# Patient Record
Sex: Female | Born: 2000 | Race: Black or African American | Hispanic: No | Smoking: Never smoker
Health system: Southern US, Community
[De-identification: ages and names within clinical notes are randomized; demographics above are authoritative.]

## PROBLEM LIST (undated history)

## (undated) DIAGNOSIS — F32A Depression, unspecified: Secondary | ICD-10-CM

## (undated) DIAGNOSIS — D649 Anemia, unspecified: Secondary | ICD-10-CM

## (undated) DIAGNOSIS — F329 Major depressive disorder, single episode, unspecified: Secondary | ICD-10-CM

## (undated) HISTORY — DX: Depression, unspecified: F32.A

## (undated) HISTORY — PX: NO PAST SURGERIES: SHX2092

---
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
## 2019-05-03 LAB — OB RESULTS CONSOLE HGB/HCT, BLOOD
HCT: 38 (ref 29–41)
Hemoglobin: 12.6

## 2019-05-03 LAB — OB RESULTS CONSOLE GC/CHLAMYDIA
Chlamydia: POSITIVE
Gonorrhea: NEGATIVE

## 2019-05-03 LAB — OB RESULTS CONSOLE ANTIBODY SCREEN: Antibody Screen: NEGATIVE

## 2019-05-03 LAB — OB RESULTS CONSOLE ABO/RH: RH Type: NEGATIVE

## 2019-05-03 LAB — OB RESULTS CONSOLE HEPATITIS B SURFACE ANTIGEN: Hepatitis B Surface Ag: NEGATIVE

## 2019-05-03 LAB — OB RESULTS CONSOLE RUBELLA ANTIBODY, IGM: Rubella: IMMUNE

## 2019-05-03 LAB — OB RESULTS CONSOLE HIV ANTIBODY (ROUTINE TESTING): HIV: NONREACTIVE

## 2019-07-11 ENCOUNTER — Other Ambulatory Visit: Payer: Self-pay

## 2019-07-11 ENCOUNTER — Ambulatory Visit (INDEPENDENT_AMBULATORY_CARE_PROVIDER_SITE_OTHER): Payer: Self-pay | Admitting: Certified Nurse Midwife

## 2019-07-11 ENCOUNTER — Encounter: Payer: Self-pay | Admitting: Certified Nurse Midwife

## 2019-07-11 VITALS — BP 100/55 | HR 77 | Ht 64.0 in | Wt 115.3 lb

## 2019-07-11 DIAGNOSIS — Z3402 Encounter for supervision of normal first pregnancy, second trimester: Secondary | ICD-10-CM

## 2019-07-11 DIAGNOSIS — Z3A23 23 weeks gestation of pregnancy: Secondary | ICD-10-CM | POA: Diagnosis not present

## 2019-07-11 NOTE — Progress Notes (Signed)
TRANSFER IN OB HISTORY AND PHYSICAL  SUBJECTIVE:       Holly Montgomery is a 18 y.o. G49P0010 female, Patient's last menstrual period was 01/28/2019., Estimated Date of Delivery: 11/04/19, [redacted]w[redacted]d, presents today for Transition of Prenatal Care. EPIC data migration from outside records is accomplished today. Complaints today include: none   Recently moved from [place] to be near her boyfriends family.   Gynecologic History Patient's last menstrual period was 01/28/2019. Normal Contraception: none Last Pap: N/A Body mass index is 19.79 kg/m.  Obstetric History OB History  Gravida Para Term Preterm AB Living  2       1    SAB TAB Ectopic Multiple Live Births  1            # Outcome Date GA Lbr Len/2nd Weight Sex Delivery Anes PTL Lv  2 Current           1 SAB 2019            Past Medical History:  Diagnosis Date  . Depression     History reviewed. No pertinent surgical history.  Current Outpatient Medications on File Prior to Visit  Medication Sig Dispense Refill  . Prenatal Vit-Fe Fumarate-FA (PRENATAL MULTIVITAMIN) TABS tablet Take 1 tablet by mouth daily at 12 noon.    . sertraline (ZOLOFT) 50 MG tablet Take 50 mg by mouth daily.     No current facility-administered medications on file prior to visit.     Allergies  Allergen Reactions  . Motrin [Ibuprofen] Hives    Social History   Socioeconomic History  . Marital status: Single    Spouse name: Not on file  . Number of children: Not on file  . Years of education: Not on file  . Highest education level: Not on file  Occupational History  . Not on file  Social Needs  . Financial resource strain: Not on file  . Food insecurity    Worry: Not on file    Inability: Not on file  . Transportation needs    Medical: Not on file    Non-medical: Not on file  Tobacco Use  . Smoking status: Never Smoker  . Smokeless tobacco: Never Used  Substance and Sexual Activity  . Alcohol use: Not Currently  . Drug use: Never  .  Sexual activity: Yes  Lifestyle  . Physical activity    Days per week: Not on file    Minutes per session: Not on file  . Stress: Not on file  Relationships  . Social Musician on phone: Not on file    Gets together: Not on file    Attends religious service: Not on file    Active member of club or organization: Not on file    Attends meetings of clubs or organizations: Not on file    Relationship status: Not on file  . Intimate partner violence    Fear of current or ex partner: Not on file    Emotionally abused: Not on file    Physically abused: Not on file    Forced sexual activity: Not on file  Other Topics Concern  . Not on file  Social History Narrative  . Not on file    History reviewed. No pertinent family history.  The following portions of the patient's history were reviewed and updated as appropriate: allergies, current medications, past OB history, past medical history, past surgical history, past family history, past social history, and problem list.  OBJECTIVE: Initial Physical Exam (New OB)  GENERAL APPEARANCE: alert, well appearing, in no apparent distress, oriented to person, place and time HEAD: normocephalic, atraumatic MOUTH: mucous membranes moist, pharynx normal without lesions THYROID: no thyromegaly or masses present BREASTS: no masses noted, no significant tenderness, no palpable axillary nodes, no skin changes LUNGS: clear to auscultation, no wheezes, rales or rhonchi, symmetric air entry HEART: regular rate and rhythm, no murmurs ABDOMEN: soft, nontender, nondistended, no abnormal masses, no epigastric pain, fundus soft, nontender 23 weeks size and FHT present EXTREMITIES: no redness or tenderness in the calves or thighs, no edema, no limitation in range of motion, intact peripheral pulses SKIN: normal coloration and turgor, no rashes LYMPH NODES: no adenopathy palpable NEUROLOGIC: alert, oriented, normal speech, no focal findings or  movement disorder noted  PELVIC EXAM EXTERNAL GENITALIA: Deferred   ASSESSMENT: Normal pregnancy  PLAN: Discussed PNC, Midwife or MD care reviewed. Pt request midwifery care. Over view of care discussed. Practice folder given. Discussed next visit 28 wk labs. She verbalizes and agrees to plan . Follow up 5 wks.   Philip Aspen, CNM

## 2019-07-11 NOTE — Patient Instructions (Signed)
Glucose Tolerance Test During Pregnancy Why am I having this test? The glucose tolerance test (GTT) is done to check how your body processes sugar (glucose). This is one of several tests used to diagnose diabetes that develops during pregnancy (gestational diabetes mellitus). Gestational diabetes is a temporary form of diabetes that some women develop during pregnancy. It usually occurs during the second trimester of pregnancy and goes away after delivery. Testing (screening) for gestational diabetes usually occurs between 24 and 28 weeks of pregnancy. You may have the GTT test after having a 1-hour glucose screening test if the results from that test indicate that you may have gestational diabetes. You may also have this test if:  You have a history of gestational diabetes.  You have a history of giving birth to very large babies or have experienced repeated fetal loss (stillbirth).  You have signs and symptoms of diabetes, such as: ? Changes in your vision. ? Tingling or numbness in your hands or feet. ? Changes in hunger, thirst, and urination that are not otherwise explained by your pregnancy. What is being tested? This test measures the amount of glucose in your blood at different times during a period of 3 hours. This indicates how well your body is able to process glucose. What kind of sample is taken?  Blood samples are required for this test. They are usually collected by inserting a needle into a blood vessel. How do I prepare for this test?  For 3 days before your test, eat normally. Have plenty of carbohydrate-rich foods.  Follow instructions from your health care provider about: ? Eating or drinking restrictions on the day of the test. You may be asked to not eat or drink anything other than water (fast) starting 8-10 hours before the test. ? Changing or stopping your regular medicines. Some medicines may interfere with this test. Tell a health care provider about:  All  medicines you are taking, including vitamins, herbs, eye drops, creams, and over-the-counter medicines.  Any blood disorders you have.  Any surgeries you have had.  Any medical conditions you have. What happens during the test? First, your blood glucose will be measured. This is referred to as your fasting blood glucose, since you fasted before the test. Then, you will drink a glucose solution that contains a certain amount of glucose. Your blood glucose will be measured again 1, 2, and 3 hours after drinking the solution. This test takes about 3 hours to complete. You will need to stay at the testing location during this time. During the testing period:  Do not eat or drink anything other than the glucose solution.  Do not exercise.  Do not use any products that contain nicotine or tobacco, such as cigarettes and e-cigarettes. If you need help stopping, ask your health care provider. The testing procedure may vary among health care providers and hospitals. How are the results reported? Your results will be reported as milligrams of glucose per deciliter of blood (mg/dL) or millimoles per liter (mmol/L). Your health care provider will compare your results to normal ranges that were established after testing a large group of people (reference ranges). Reference ranges may vary among labs and hospitals. For this test, common reference ranges are:  Fasting: less than 95-105 mg/dL (5.3-5.8 mmol/L).  1 hour after drinking glucose: less than 180-190 mg/dL (10.0-10.5 mmol/L).  2 hours after drinking glucose: less than 155-165 mg/dL (8.6-9.2 mmol/L).  3 hours after drinking glucose: 140-145 mg/dL (7.8-8.1 mmol/L). What do the   results mean? Results within reference ranges are considered normal, meaning that your glucose levels are well-controlled. If two or more of your blood glucose levels are high, you may be diagnosed with gestational diabetes. If only one level is high, your health care  provider may suggest repeat testing or other tests to confirm a diagnosis. Talk with your health care provider about what your results mean. Questions to ask your health care provider Ask your health care provider, or the department that is doing the test:  When will my results be ready?  How will I get my results?  What are my treatment options?  What other tests do I need?  What are my next steps? Summary  The glucose tolerance test (GTT) is one of several tests used to diagnose diabetes that develops during pregnancy (gestational diabetes mellitus). Gestational diabetes is a temporary form of diabetes that some women develop during pregnancy.  You may have the GTT test after having a 1-hour glucose screening test if the results from that test indicate that you may have gestational diabetes. You may also have this test if you have any symptoms or risk factors for gestational diabetes.  Talk with your health care provider about what your results mean. This information is not intended to replace advice given to you by your health care provider. Make sure you discuss any questions you have with your health care provider. Document Released: 01/20/2012 Document Revised: 11/11/2018 Document Reviewed: 03/02/2017 Elsevier Patient Education  2020 Elsevier Inc.  

## 2019-08-01 ENCOUNTER — Telehealth: Payer: Self-pay

## 2019-08-01 NOTE — Telephone Encounter (Signed)
mychart message sent to patient

## 2019-08-01 NOTE — Telephone Encounter (Signed)
mychart message sent

## 2019-08-02 ENCOUNTER — Telehealth: Payer: Self-pay | Admitting: Certified Nurse Midwife

## 2019-08-02 NOTE — Telephone Encounter (Signed)
Patient is very concerned about her breast. Itchy blistering & red. Would like to be seen asap. Please call to advise

## 2019-08-02 NOTE — Telephone Encounter (Signed)
I called pt she said she will go to urgent care if it gets worse.

## 2019-08-05 NOTE — L&D Delivery Note (Signed)
       Delivery Note   Holly Montgomery is a 19 y.o. G2P0010 at [redacted]w[redacted]d Estimated Date of Delivery: 11/04/19  PRE-OPERATIVE DIAGNOSIS:  1) [redacted]w[redacted]d pregnancy.   POST-OPERATIVE DIAGNOSIS:  1) [redacted]w[redacted]d pregnancy s/p Vaginal, Spontaneous   Delivery Type: Vaginal, Spontaneous    Delivery Anesthesia: Epidural   Labor Complications:  None    ESTIMATED BLOOD LOSS: 460 ml    FINDINGS:   1) female infant, Apgar scores of  8  at 1 minute and  9  at 5 minutes and a birthweight pending , infant remains skin to skin.   2) Nuchal cord: No  SPECIMENS:   PLACENTA:   Appearance: Intact , 3 vessels, cord blood collected   Removal: Spontaneous      Disposition:  Held per protocol then discarded.   DISPOSITION:  Infant to left in stable condition in the delivery room, with L&D personnel and mother,  NARRATIVE SUMMARY: Labor course:  Holly Montgomery is a G2P0010 at [redacted]w[redacted]d who presented for labor management.  She progressed well in labor without pitocin.  She received the appropriate epidural anesthesia and proceeded to complete dilation. She evidenced good maternal expulsive effort during the second stage. She went on to deliver a viable female infant "Bear". The placenta delivered without problems and was noted to be complete. A perineal and vaginal examination was performed. Lacerations:  Bilateral periurethral skid marks, hemostatic not in need of repair. The patient tolerated this well.  Doreene Burke, CNM  10/19/2019 3:46 AM

## 2019-08-15 ENCOUNTER — Other Ambulatory Visit: Payer: Medicaid Other

## 2019-08-15 ENCOUNTER — Ambulatory Visit (INDEPENDENT_AMBULATORY_CARE_PROVIDER_SITE_OTHER): Payer: Medicaid Other | Admitting: Certified Nurse Midwife

## 2019-08-15 ENCOUNTER — Other Ambulatory Visit: Payer: Self-pay

## 2019-08-15 ENCOUNTER — Encounter: Payer: Self-pay | Admitting: Certified Nurse Midwife

## 2019-08-15 VITALS — BP 106/57 | HR 93 | Wt 124.3 lb

## 2019-08-15 DIAGNOSIS — Z3402 Encounter for supervision of normal first pregnancy, second trimester: Secondary | ICD-10-CM | POA: Diagnosis not present

## 2019-08-15 DIAGNOSIS — Z3A28 28 weeks gestation of pregnancy: Secondary | ICD-10-CM

## 2019-08-15 LAB — POCT URINALYSIS DIPSTICK OB
Bilirubin, UA: NEGATIVE
Blood, UA: NEGATIVE
Glucose, UA: NEGATIVE
Ketones, UA: NEGATIVE
Leukocytes, UA: NEGATIVE
Nitrite, UA: NEGATIVE
POC,PROTEIN,UA: NEGATIVE
Spec Grav, UA: 1.02 (ref 1.010–1.025)
Urobilinogen, UA: 0.2 E.U./dL
pH, UA: 5 (ref 5.0–8.0)

## 2019-08-15 MED ORDER — RHO D IMMUNE GLOBULIN 1500 UNITS IM SOSY
1500.0000 [IU] | PREFILLED_SYRINGE | Freq: Once | INTRAMUSCULAR | Status: AC
  Administered 2019-08-15: 1500 [IU] via INTRAMUSCULAR

## 2019-08-15 MED ORDER — TETANUS-DIPHTH-ACELL PERTUSSIS 5-2.5-18.5 LF-MCG/0.5 IM SUSP
0.5000 mL | Freq: Once | INTRAMUSCULAR | Status: AC
  Administered 2019-08-15: 0.5 mL via INTRAMUSCULAR

## 2019-08-15 NOTE — Patient Instructions (Signed)
Glucose Tolerance Test During Pregnancy Why am I having this test? The glucose tolerance test (GTT) is done to check how your body processes sugar (glucose). This is one of several tests used to diagnose diabetes that develops during pregnancy (gestational diabetes mellitus). Gestational diabetes is a temporary form of diabetes that some women develop during pregnancy. It usually occurs during the second trimester of pregnancy and goes away after delivery. Testing (screening) for gestational diabetes usually occurs between 24 and 28 weeks of pregnancy. You may have the GTT test after having a 1-hour glucose screening test if the results from that test indicate that you may have gestational diabetes. You may also have this test if:  You have a history of gestational diabetes.  You have a history of giving birth to very large babies or have experienced repeated fetal loss (stillbirth).  You have signs and symptoms of diabetes, such as: ? Changes in your vision. ? Tingling or numbness in your hands or feet. ? Changes in hunger, thirst, and urination that are not otherwise explained by your pregnancy. What is being tested? This test measures the amount of glucose in your blood at different times during a period of 3 hours. This indicates how well your body is able to process glucose. What kind of sample is taken?  Blood samples are required for this test. They are usually collected by inserting a needle into a blood vessel. How do I prepare for this test?  For 3 days before your test, eat normally. Have plenty of carbohydrate-rich foods.  Follow instructions from your health care provider about: ? Eating or drinking restrictions on the day of the test. You may be asked to not eat or drink anything other than water (fast) starting 8-10 hours before the test. ? Changing or stopping your regular medicines. Some medicines may interfere with this test. Tell a health care provider about:  All  medicines you are taking, including vitamins, herbs, eye drops, creams, and over-the-counter medicines.  Any blood disorders you have.  Any surgeries you have had.  Any medical conditions you have. What happens during the test? First, your blood glucose will be measured. This is referred to as your fasting blood glucose, since you fasted before the test. Then, you will drink a glucose solution that contains a certain amount of glucose. Your blood glucose will be measured again 1, 2, and 3 hours after drinking the solution. This test takes about 3 hours to complete. You will need to stay at the testing location during this time. During the testing period:  Do not eat or drink anything other than the glucose solution.  Do not exercise.  Do not use any products that contain nicotine or tobacco, such as cigarettes and e-cigarettes. If you need help stopping, ask your health care provider. The testing procedure may vary among health care providers and hospitals. How are the results reported? Your results will be reported as milligrams of glucose per deciliter of blood (mg/dL) or millimoles per liter (mmol/L). Your health care provider will compare your results to normal ranges that were established after testing a large group of people (reference ranges). Reference ranges may vary among labs and hospitals. For this test, common reference ranges are:  Fasting: less than 95-105 mg/dL (5.3-5.8 mmol/L).  1 hour after drinking glucose: less than 180-190 mg/dL (10.0-10.5 mmol/L).  2 hours after drinking glucose: less than 155-165 mg/dL (8.6-9.2 mmol/L).  3 hours after drinking glucose: 140-145 mg/dL (7.8-8.1 mmol/L). What do the   results mean? Results within reference ranges are considered normal, meaning that your glucose levels are well-controlled. If two or more of your blood glucose levels are high, you may be diagnosed with gestational diabetes. If only one level is high, your health care  provider may suggest repeat testing or other tests to confirm a diagnosis. Talk with your health care provider about what your results mean. Questions to ask your health care provider Ask your health care provider, or the department that is doing the test:  When will my results be ready?  How will I get my results?  What are my treatment options?  What other tests do I need?  What are my next steps? Summary  The glucose tolerance test (GTT) is one of several tests used to diagnose diabetes that develops during pregnancy (gestational diabetes mellitus). Gestational diabetes is a temporary form of diabetes that some women develop during pregnancy.  You may have the GTT test after having a 1-hour glucose screening test if the results from that test indicate that you may have gestational diabetes. You may also have this test if you have any symptoms or risk factors for gestational diabetes.  Talk with your health care provider about what your results mean. This information is not intended to replace advice given to you by your health care provider. Make sure you discuss any questions you have with your health care provider. Document Revised: 11/11/2018 Document Reviewed: 03/02/2017 Elsevier Patient Education  2020 Elsevier Inc.  

## 2019-08-15 NOTE — Progress Notes (Signed)
ROB doing well. Feels good movement. 28 wk labs today. Tdap/Rhogam/CBC/RPR/BTC/glucose screen today. Pt complains of round ligament pain. Reassurance given, self help measures reviewed. Discussed BTC and signed. BC after baby -plans depo injections. Information given. Birth plan discussed, sample given. Follow up 2 wks.   Leola Brazil, CNM

## 2019-08-16 ENCOUNTER — Other Ambulatory Visit: Payer: Self-pay | Admitting: Certified Nurse Midwife

## 2019-08-16 LAB — CBC
Hematocrit: 31.9 % — ABNORMAL LOW (ref 34.0–46.6)
Hemoglobin: 10.5 g/dL — ABNORMAL LOW (ref 11.1–15.9)
MCH: 28.6 pg (ref 26.6–33.0)
MCHC: 32.9 g/dL (ref 31.5–35.7)
MCV: 87 fL (ref 79–97)
Platelets: 251 10*3/uL (ref 150–450)
RBC: 3.67 x10E6/uL — ABNORMAL LOW (ref 3.77–5.28)
RDW: 12.9 % (ref 11.7–15.4)
WBC: 10.1 10*3/uL (ref 3.4–10.8)

## 2019-08-16 LAB — RPR: RPR Ser Ql: NONREACTIVE

## 2019-08-16 LAB — GLUCOSE, 1 HOUR GESTATIONAL: Gestational Diabetes Screen: 92 mg/dL (ref 65–139)

## 2019-08-16 LAB — VARICELLA ZOSTER ANTIBODY, IGG: Varicella zoster IgG: <135 index — ABNORMAL LOW (ref >165)

## 2019-08-16 MED ORDER — FUSION PLUS PO CAPS: 1.0000 | ORAL_CAPSULE | Freq: Every day | ORAL | 6 refills | Status: DC

## 2019-08-16 NOTE — Progress Notes (Signed)
Anemia noted at 28 wk labs, fusion plus ordered.   Doreene Burke, CNM

## 2019-08-28 ENCOUNTER — Other Ambulatory Visit: Payer: Self-pay

## 2019-08-28 ENCOUNTER — Encounter: Payer: Self-pay | Admitting: Obstetrics and Gynecology

## 2019-08-28 ENCOUNTER — Observation Stay
Admission: EM | Admit: 2019-08-28 | Discharge: 2019-08-28 | Disposition: A | Discharge status: Home / Self Care | Payer: Medicaid Other | Attending: Obstetrics and Gynecology | Admitting: Obstetrics and Gynecology

## 2019-08-28 DIAGNOSIS — O26893 Other specified pregnancy related conditions, third trimester: Principal | ICD-10-CM | POA: Insufficient documentation

## 2019-08-28 DIAGNOSIS — R109 Unspecified abdominal pain: Secondary | ICD-10-CM | POA: Diagnosis not present

## 2019-08-28 DIAGNOSIS — Z3A30 30 weeks gestation of pregnancy: Secondary | ICD-10-CM | POA: Diagnosis not present

## 2019-08-28 HISTORY — DX: Anemia, unspecified: D64.9

## 2019-08-29 ENCOUNTER — Ambulatory Visit (INDEPENDENT_AMBULATORY_CARE_PROVIDER_SITE_OTHER): Payer: Medicaid Other | Admitting: Certified Nurse Midwife

## 2019-08-29 ENCOUNTER — Encounter: Payer: Self-pay | Admitting: Certified Nurse Midwife

## 2019-08-29 VITALS — BP 103/58 | HR 94 | Wt 127.1 lb

## 2019-08-29 DIAGNOSIS — Z3402 Encounter for supervision of normal first pregnancy, second trimester: Secondary | ICD-10-CM

## 2019-08-29 NOTE — Patient Instructions (Signed)
[facility]  [facility]  [address], [place], [place] [address]  Phone: [phone]  [facility] (second location)  [address] [place], [place] [address]  Phone: [phone]  [facility] ([place]) [address], [place], [place] [address] Phone: [phone]  [facility]  [address] [place],  [address]  Phone: [phone] 

## 2019-08-29 NOTE — Progress Notes (Signed)
ROB doing well. Feels good movement. Birth plan reviewed. Copy into chart. Pt wants epidural and plans to breastfeed. She wants to see the placenta, and dad to help deliver baby. Plan depo injection for BC.  Follow up 2 wks,   Doreene Burke, CNM

## 2019-08-29 NOTE — Progress Notes (Signed)
error 

## 2019-08-30 NOTE — Discharge Summary (Signed)
    L&D OB Triage Note  SUBJECTIVE Holly Montgomery is a 19 y.o. G2P0010 female at 102w4d, EDD Estimated Date of Delivery: 11/04/19 who presented to triage with complaints of abdominal pain.  Denies vaginal bleeding.  Denies urinary symptoms.  OB History  Gravida Para Term Preterm AB Living  2 0 0 0 1 0  SAB TAB Ectopic Multiple Live Births  1 0 0 0 0    # Outcome Date GA Lbr Len/2nd Weight Sex Delivery Anes PTL Lv  2 Current           1 SAB 2019            No medications prior to admission.     OBJECTIVE  Nursing Evaluation:   BP 119/62 (BP Location: Right Arm)   Pulse 88   Temp 98.5 F (36.9 C) (Oral)   Resp 16   Ht 5\' 4"  (1.626 m)   Wt 56.2 kg   LMP 01/28/2019   BMI 21.28 kg/m    Findings:        No evidence of labor.  Baby reassuring.       NST was performed and has been reviewed by me.  NST INTERPRETATION: Category I  Mode: External Baseline Rate (A): 140 bpm Variability: Moderate Accelerations: 15 x 15 Decelerations: None     Contraction Frequency (min): ui with ctx x 1  ASSESSMENT Impression:  1.  Pregnancy:  G2P0010 at [redacted]w[redacted]d , EDD Estimated Date of Delivery: 11/04/19 2.  Reassuring fetal and maternal status  PLAN 1. Discussed current condition and above findings with patient and reassurance given.  All questions answered. 2. Discharge home with standard labor precautions given to return to L&D or call the office for problems. 3. Continue routine prenatal care.

## 2019-09-12 ENCOUNTER — Ambulatory Visit (INDEPENDENT_AMBULATORY_CARE_PROVIDER_SITE_OTHER): Payer: Medicaid Other | Admitting: Certified Nurse Midwife

## 2019-09-12 ENCOUNTER — Other Ambulatory Visit: Payer: Self-pay

## 2019-09-12 ENCOUNTER — Encounter: Payer: Self-pay | Admitting: Certified Nurse Midwife

## 2019-09-12 DIAGNOSIS — Z3403 Encounter for supervision of normal first pregnancy, third trimester: Secondary | ICD-10-CM

## 2019-09-12 NOTE — Lactation Note (Signed)
Lactation Consultation Note  Patient Name: Victor Granados [id number] Date: 09/12/2019   Lactation student discussed benefits of breastfeeding per the Ready, Set, Baby curriculum. Arlana Pouch encouraged to review breastfeeding information on Ready, set, [employer] site and given information for virtual breastfeeding classes.   Maternal Data    Feeding    LATCH Score                   Interventions    Lactation Tools Discussed/Used     Consult Status      Cornelious Bryant 09/12/2019, 3:35 PM

## 2019-09-12 NOTE — Progress Notes (Signed)
ROB doing well. Feels good movement. Denies complaints. Follow up 2 wks with Marcelino Duster.   Doreene Burke, CNM

## 2019-09-12 NOTE — Patient Instructions (Signed)
Braxton Hicks Contractions °Contractions of the uterus can occur throughout pregnancy, but they are not always a sign that you are in labor. You may have practice contractions called Braxton Hicks contractions. These false labor contractions are sometimes confused with true labor. °What are Braxton Hicks contractions? °Braxton Hicks contractions are tightening movements that occur in the muscles of the uterus before labor. Unlike true labor contractions, these contractions do not result in opening (dilation) and thinning of the cervix. Toward the end of pregnancy (32-34 weeks), Braxton Hicks contractions can happen more often and may become stronger. These contractions are sometimes difficult to tell apart from true labor because they can be very uncomfortable. You should not feel embarrassed if you go to the hospital with false labor. °Sometimes, the only way to tell if you are in true labor is for your health care provider to look for changes in the cervix. The health care provider will do a physical exam and may monitor your contractions. If you are not in true labor, the exam should show that your cervix is not dilating and your water has not broken. °If there are no other health problems associated with your pregnancy, it is completely safe for you to be sent home with false labor. You may continue to have Braxton Hicks contractions until you go into true labor. °How to tell the difference between true labor and false labor °True labor °· Contractions last 30-70 seconds. °· Contractions become very regular. °· Discomfort is usually felt in the top of the uterus, and it spreads to the lower abdomen and low back. °· Contractions do not go away with walking. °· Contractions usually become more intense and increase in frequency. °· The cervix dilates and gets thinner. °False labor °· Contractions are usually shorter and not as strong as true labor contractions. °· Contractions are usually irregular. °· Contractions  are often felt in the front of the lower abdomen and in the groin. °· Contractions may go away when you walk around or change positions while lying down. °· Contractions get weaker and are shorter-lasting as time goes on. °· The cervix usually does not dilate or become thin. °Follow these instructions at home: ° °· Take over-the-counter and prescription medicines only as told by your health care provider. °· Keep up with your usual exercises and follow other instructions from your health care provider. °· Eat and drink lightly if you think you are going into labor. °· If Braxton Hicks contractions are making you uncomfortable: °? Change your position from lying down or resting to walking, or change from walking to resting. °? Sit and rest in a tub of warm water. °? Drink enough fluid to keep your urine pale yellow. Dehydration may cause these contractions. °? Do slow and deep breathing several times an hour. °· Keep all follow-up prenatal visits as told by your health care provider. This is important. °Contact a health care provider if: °· You have a fever. °· You have continuous pain in your abdomen. °Get help right away if: °· Your contractions become stronger, more regular, and closer together. °· You have fluid leaking or gushing from your vagina. °· You pass blood-tinged mucus (bloody show). °· You have bleeding from your vagina. °· You have low back pain that you never had before. °· You feel your baby’s head pushing down and causing pelvic pressure. °· Your baby is not moving inside you as much as it used to. °Summary °· Contractions that occur before labor are   called Braxton Hicks contractions, false labor, or practice contractions. °· Braxton Hicks contractions are usually shorter, weaker, farther apart, and less regular than true labor contractions. True labor contractions usually become progressively stronger and regular, and they become more frequent. °· Manage discomfort from Braxton Hicks contractions  by changing position, resting in a warm bath, drinking plenty of water, or practicing deep breathing. °This information is not intended to replace advice given to you by your health care provider. Make sure you discuss any questions you have with your health care provider. °Document Revised: 07/03/2017 Document Reviewed: 12/04/2016 °Elsevier Patient Education © 2020 [employer] ° °

## 2019-09-21 ENCOUNTER — Observation Stay
Admission: EM | Admit: 2019-09-21 | Discharge: 2019-09-21 | Disposition: A | Discharge status: Home / Self Care | Payer: Medicaid Other | Attending: Certified Nurse Midwife | Admitting: Certified Nurse Midwife

## 2019-09-21 ENCOUNTER — Other Ambulatory Visit: Payer: Self-pay

## 2019-09-21 ENCOUNTER — Encounter: Payer: Self-pay | Admitting: Obstetrics and Gynecology

## 2019-09-21 DIAGNOSIS — O4703 False labor before 37 completed weeks of gestation, third trimester: Principal | ICD-10-CM | POA: Insufficient documentation

## 2019-09-21 DIAGNOSIS — Z3A33 33 weeks gestation of pregnancy: Secondary | ICD-10-CM | POA: Diagnosis not present

## 2019-09-21 DIAGNOSIS — Z886 Allergy status to analgesic agent status: Secondary | ICD-10-CM | POA: Diagnosis not present

## 2019-09-21 DIAGNOSIS — Z79899 Other long term (current) drug therapy: Secondary | ICD-10-CM | POA: Insufficient documentation

## 2019-09-21 LAB — URINALYSIS, ROUTINE W REFLEX MICROSCOPIC
Bilirubin Urine: NEGATIVE
Glucose, UA: NEGATIVE mg/dL
Hgb urine dipstick: NEGATIVE
Ketones, ur: NEGATIVE mg/dL
Leukocytes,Ua: NEGATIVE
Nitrite: NEGATIVE
Protein, ur: NEGATIVE mg/dL
Specific Gravity, Urine: 1.006 (ref 1.005–1.030)
pH: 7 (ref 5.0–8.0)

## 2019-09-21 LAB — WET PREP, GENITAL
Clue Cells Wet Prep HPF POC: NONE SEEN
Sperm: NONE SEEN
Trich, Wet Prep: NONE SEEN
Yeast Wet Prep HPF POC: NONE SEEN

## 2019-09-21 LAB — CHLAMYDIA/NGC RT PCR (ARMC ONLY)
Chlamydia Tr: NOT DETECTED
N gonorrhoeae: NOT DETECTED

## 2019-09-21 LAB — FETAL FIBRONECTIN: Fetal Fibronectin: NEGATIVE

## 2019-09-21 MED ORDER — NIFEDIPINE 10 MG PO CAPS: 10.0000 mg | ORAL_CAPSULE | Freq: Four times a day (QID) | ORAL | Status: DC

## 2019-09-21 MED ORDER — MORPHINE SULFATE (PF) 4 MG/ML IV SOLN: 4.0000 mg | INTRAVENOUS | Status: DC | PRN

## 2019-09-21 MED ORDER — PROMETHAZINE HCL 25 MG/ML IJ SOLN: 25.0000 mg | Freq: Four times a day (QID) | INTRAMUSCULAR | Status: DC | PRN

## 2019-09-21 MED ORDER — ACETAMINOPHEN 500 MG PO TABS
1000.0000 mg | ORAL_TABLET | Freq: Once | ORAL | Status: AC
  Administered 2019-09-21: 13:00:00 1000 mg via ORAL

## 2019-09-21 MED ORDER — NIFEDIPINE 10 MG PO CAPS: 10.0000 mg | ORAL_CAPSULE | Freq: Four times a day (QID) | ORAL | 0 refills | Status: DC | PRN

## 2019-09-21 MED ORDER — ACETAMINOPHEN 500 MG PO TABS
ORAL_TABLET | ORAL | Status: AC
  Filled 2019-09-21: qty 2

## 2019-09-21 MED ORDER — NIFEDIPINE 10 MG PO CAPS
30.0000 mg | ORAL_CAPSULE | Freq: Once | ORAL | Status: AC
  Administered 2019-09-21: 30 mg via ORAL
  Filled 2019-09-21: qty 3

## 2019-09-21 MED ORDER — HYDROXYZINE HCL 50 MG PO TABS
50.0000 mg | ORAL_TABLET | Freq: Once | ORAL | Status: AC
  Administered 2019-09-21: 50 mg via ORAL
  Filled 2019-09-21: qty 1

## 2019-09-21 NOTE — Progress Notes (Signed)
RN reviewed discharge instructions with patient and patient verbalized understanding. Patient left with significant other.

## 2019-09-21 NOTE — Telephone Encounter (Signed)
Patient called saying that she was having really bad braxton hicks and was having some light bleeding. She said she feels nauseated and has a headache.   -TC

## 2019-09-21 NOTE — OB Triage Note (Signed)
Pt reports ctx that started around 3:00am and have been consistent. She denies any leaking fluid or vaginal bleeding.

## 2019-09-22 NOTE — Discharge Summary (Signed)
Antenatal Discharge Summary  Patient ID: Holly Montgomery MRN: [id number] DOB/AGE: 2001/07/22 19 y.o.  Admit date: 09/21/2019   Discharge date: 09/21/2019  Admission Diagnoses: Observation-preterm Contractions   Discharge Diagnoses: No other diagnosis  Prenatal Procedures: NST, Fetal fibronectin, Procardia  Significant Diagnostic Studies:  Results for orders placed or performed during the hospital encounter of 09/21/19 (from the past 168 hour(s))  Wet prep, genital   Collection Time: 09/21/19 12:50 PM   Specimen: Urine, Clean Catch  Result Value Ref Range   Yeast Wet Prep HPF POC NONE SEEN NONE SEEN   Trich, Wet Prep NONE SEEN NONE SEEN   Clue Cells Wet Prep HPF POC NONE SEEN NONE SEEN   WBC, Wet Prep HPF POC RARE (A) NONE SEEN   Sperm NONE SEEN   Chlamydia/NGC rt PCR ([facility] only)   Collection Time: 09/21/19 12:50 PM   Specimen: Urine; GU  Result Value Ref Range   Specimen source GC/Chlam URINE, RANDOM    Chlamydia Tr NOT DETECTED NOT DETECTED   N gonorrhoeae NOT DETECTED NOT DETECTED  Urinalysis, Routine w reflex microscopic   Collection Time: 09/21/19 12:50 PM  Result Value Ref Range   Color, Urine STRAW (A) YELLOW   APPearance CLEAR (A) CLEAR   Specific Gravity, Urine 1.006 1.005 - 1.030   pH 7.0 5.0 - 8.0   Glucose, UA NEGATIVE NEGATIVE mg/dL   Hgb urine dipstick NEGATIVE NEGATIVE   Bilirubin Urine NEGATIVE NEGATIVE   Ketones, ur NEGATIVE NEGATIVE mg/dL   Protein, ur NEGATIVE NEGATIVE mg/dL   Nitrite NEGATIVE NEGATIVE   Leukocytes,Ua NEGATIVE NEGATIVE  Fetal fibronectin   Collection Time: 09/21/19 12:50 PM  Result Value Ref Range   Fetal Fibronectin NEGATIVE NEGATIVE    Treatments: Oral hydration and Tocolysis with Procardia  Hospital Course:   This is a 19 y.o. G2P0010 with IUP at [redacted]w[redacted]d seen in triage for regular, painful contractions for the last 24 hours unresolved with home treatment measures.   Denies leaking of fluid and vaginal bleeding.    She was  started on Procardia after a negative fetal fibronectin. Fetal heart rate monitoring remained reassuring and she had no signs/symptoms of progressing preterm labor or other maternal-fetal concerns.    She was deemed stable for discharge to home with outpatient follow up.  Review of Systems:  ROS negative except as noted above. Information obtained from patient and bedside nurse.   Discharge Exam:  BP (!) 100/55 (BP Location: Left Arm)   Pulse (!) 106   Temp 98.3 F (36.8 C) (Oral)   Resp 14   LMP 01/28/2019  General appearance: alert and cooperative Resp: clear to auscultation bilaterally GI: soft, non-tender; bowel sounds normal; no masses,  no organomegaly and gravid Pelvic: external genitalia normal  Dilation: 1 Effacement (%): 50 Station: -2 Presentation: Vertex Exam by:: Serafina Royals, CNM  Urinalysis, Routine w reflex microscopic     Status: Abnormal   Collection Time: 09/21/19 12:50 PM  Result Value Ref Range   Color, Urine STRAW (A) YELLOW   APPearance CLEAR (A) CLEAR   Specific Gravity, Urine 1.006 1.005 - 1.030   pH 7.0 5.0 - 8.0   Glucose, UA NEGATIVE NEGATIVE mg/dL   Hgb urine dipstick NEGATIVE NEGATIVE   Bilirubin Urine NEGATIVE NEGATIVE   Ketones, ur NEGATIVE NEGATIVE mg/dL   Protein, ur NEGATIVE NEGATIVE mg/dL   Nitrite NEGATIVE NEGATIVE   Leukocytes,Ua NEGATIVE NEGATIVE    Comment: Performed at [facility], [address], [place], [place] [address]  Fetal fibronectin     Status: None   Collection Time: 09/21/19 12:50 PM  Result Value Ref Range   Fetal Fibronectin NEGATIVE NEGATIVE    Comment: Performed at [facility], [address] [place], [place] [address]  Wet prep, genital     Status: Abnormal   Collection Time: 09/21/19 12:50 PM   Specimen: Urine, Clean Catch  Result Value Ref Range   Yeast Wet Prep HPF POC NONE SEEN NONE SEEN   Trich, Wet Prep NONE SEEN NONE SEEN   Clue Cells Wet Prep HPF POC NONE SEEN NONE SEEN    WBC, Wet Prep HPF POC RARE (A) NONE SEEN   Sperm NONE SEEN     Comment: Performed at [facility], [address] [place], [place] [address]  Chlamydia/NGC rt PCR [facility] only)     Status: None   Collection Time: 09/21/19 12:50 PM   Specimen: Urine; GU  Result Value Ref Range   Specimen source GC/Chlam URINE, RANDOM    Chlamydia Tr NOT DETECTED NOT DETECTED   N gonorrhoeae NOT DETECTED NOT DETECTED    Comment: (NOTE) This CT/NG assay has not been evaluated in patients with a history of  hysterectomy. Performed at [facility], [address] [place], [place] [address]     Discharge Condition: Stable  Disposition: Discharge disposition: 01-Home or Self Care       Discharge Instructions    Discharge activity:  No Restrictions   Complete by: As directed    Discharge diet:  No restrictions   Complete by: As directed    Discharge instructions   Complete by: As directed    See AVS   Do not have sex or do anything that might make you have an orgasm   Complete by: As directed    Fetal Kick Count:  Lie on our left side for one hour after a meal, and count the number of times your baby kicks.  If it is less than 5 times, get up, move around and drink some juice.  Repeat the test 30 minutes later.  If it is still less than 5 kicks in an hour, notify your doctor.   Complete by: As directed    Notify physician for a general feeling that "something is not right"   Complete by: As directed    Notify physician for increase or change in vaginal discharge   Complete by: As directed    Notify physician for intestinal cramps, with or without diarrhea, sometimes described as "gas pain"   Complete by: As directed    Notify physician for leaking of fluid   Complete by: As directed    Notify physician for low, dull backache, unrelieved by heat or Tylenol   Complete by: As directed    Notify physician for menstrual like cramps   Complete by: As directed    Notify  physician for pelvic pressure   Complete by: As directed    Notify physician for uterine contractions.  These may be painless and feel like the uterus is tightening or the baby is  "balling up"   Complete by: As directed    Notify physician for vaginal bleeding   Complete by: As directed    PRETERM LABOR:  Includes any of the follwing symptoms that occur between 20 - [redacted] weeks gestation.  If these symptoms are not stopped, preterm labor can result in preterm delivery, placing your baby at risk   Complete by: As directed  Allergies as of 09/21/2019      Reactions   Motrin [ibuprofen] Hives   Latex Rash      Medication List    TAKE these medications   Fusion Plus Caps Take 1 tablet by mouth daily.   NIFEdipine 10 MG capsule Commonly known as: PROCARDIA Take 1 capsule (10 mg total) by mouth every 6 (six) hours as needed (contractions).   prenatal multivitamin Tabs tablet Take 1 tablet by mouth daily at 12 noon.   sertraline 50 MG tablet Commonly known as: ZOLOFT Take 50 mg by mouth daily.       Diona Fanti, CNM [facility], [facility] 09/22/19 1:54 PM

## 2019-09-26 ENCOUNTER — Encounter: Payer: Medicaid Other | Admitting: Certified Nurse Midwife

## 2019-09-26 ENCOUNTER — Other Ambulatory Visit: Payer: Self-pay

## 2019-09-26 ENCOUNTER — Ambulatory Visit (INDEPENDENT_AMBULATORY_CARE_PROVIDER_SITE_OTHER): Payer: Medicaid Other | Admitting: Certified Nurse Midwife

## 2019-09-26 VITALS — BP 107/74 | HR 100 | Wt 127.8 lb

## 2019-09-26 DIAGNOSIS — Z3A34 34 weeks gestation of pregnancy: Secondary | ICD-10-CM

## 2019-09-26 DIAGNOSIS — Z3493 Encounter for supervision of normal pregnancy, unspecified, third trimester: Secondary | ICD-10-CM

## 2019-09-26 DIAGNOSIS — G4700 Insomnia, unspecified: Secondary | ICD-10-CM

## 2019-09-26 LAB — POCT URINALYSIS DIPSTICK OB
Bilirubin, UA: NEGATIVE
Blood, UA: NEGATIVE
Glucose, UA: NEGATIVE
Ketones, UA: NEGATIVE
Leukocytes, UA: NEGATIVE
Nitrite, UA: NEGATIVE
POC,PROTEIN,UA: NEGATIVE
Spec Grav, UA: 1.01 (ref 1.010–1.025)
Urobilinogen, UA: 0.2 E.U./dL
pH, UA: 7 (ref 5.0–8.0)

## 2019-09-26 MED ORDER — ZOLPIDEM TARTRATE 5 MG PO TABS: 5.0000 mg | ORAL_TABLET | Freq: Every evening | ORAL | 0 refills | Status: DC | PRN

## 2019-09-26 NOTE — Progress Notes (Signed)
ROB-Patient c/o difficulty falling and staying a sleep x2 months, takes Benadryl or Tylenol PM with no relief.

## 2019-09-26 NOTE — Progress Notes (Signed)
Patient declined to talk to lactation about Ready, Set, Baby and breastfeeding basics.  Patient spoke w/ someone last time and did not have any questions.  Patient plans on breastfeeding.

## 2019-09-26 NOTE — Patient Instructions (Signed)
Fetal Movement Counts Patient Name: ________________________________________________ Patient Due Date: ____________________ What is a fetal movement count?  A fetal movement count is the number of times that you feel your baby move during a certain amount of time. This may also be called a fetal kick count. A fetal movement count is recommended for every pregnant woman. You may be asked to start counting fetal movements as early as week 28 of your pregnancy. Pay attention to when your baby is most active. You may notice your baby's sleep and wake cycles. You may also notice things that make your baby move more. You should do a fetal movement count:  When your baby is normally most active.  At the same time each day. A good time to count movements is while you are resting, after having something to eat and drink. How do I count fetal movements? 1. Find a quiet, comfortable area. Sit, or lie down on your side. 2. Write down the date, the start time and stop time, and the number of movements that you felt between those two times. Take this information with you to your health care visits. 3. Write down your start time when you feel the first movement. 4. Count kicks, flutters, swishes, rolls, and jabs. You should feel at least 10 movements. 5. You may stop counting after you have felt 10 movements, or if you have been counting for 2 hours. Write down the stop time. 6. If you do not feel 10 movements in 2 hours, contact your health care provider for further instructions. Your health care provider may want to do additional tests to assess your baby's well-being. Contact a health care provider if:  You feel fewer than 10 movements in 2 hours.  Your baby is not moving like he or she usually does. Date: ____________ Start time: ____________ Stop time: ____________ Movements: ____________ Date: ____________ Start time: ____________ Stop time: ____________ Movements: ____________ Date: ____________  Start time: ____________ Stop time: ____________ Movements: ____________ Date: ____________ Start time: ____________ Stop time: ____________ Movements: ____________ Date: ____________ Start time: ____________ Stop time: ____________ Movements: ____________ Date: ____________ Start time: ____________ Stop time: ____________ Movements: ____________ Date: ____________ Start time: ____________ Stop time: ____________ Movements: ____________ Date: ____________ Start time: ____________ Stop time: ____________ Movements: ____________ Date: ____________ Start time: ____________ Stop time: ____________ Movements: ____________ This information is not intended to replace advice given to you by your health care provider. Make sure you discuss any questions you have with your health care provider. Document Revised: 03/10/2019 Document Reviewed: 03/10/2019 Elsevier Patient Education  2020 Elsevier Inc.  

## 2019-09-26 NOTE — Progress Notes (Signed)
ROB-Reports difficulty falling and staying asleep for the last two (2) months, no relief with home treatment measures. Rx Ambien, see orders. Anticipatory guidance regarding course of prenatal care. Reviewed red flag symptoms and when to call. RTC x 2 weeks for 36 week cultures and ROB or sooner if needed.

## 2019-09-29 ENCOUNTER — Encounter: Payer: Self-pay | Admitting: Obstetrics and Gynecology

## 2019-09-29 ENCOUNTER — Observation Stay
Admission: EM | Admit: 2019-09-29 | Discharge: 2019-09-29 | Disposition: A | Discharge status: Home / Self Care | Payer: Medicaid Other | Attending: Certified Nurse Midwife | Admitting: Certified Nurse Midwife

## 2019-09-29 ENCOUNTER — Encounter: Payer: Medicaid Other | Admitting: Certified Nurse Midwife

## 2019-09-29 ENCOUNTER — Other Ambulatory Visit: Payer: Self-pay

## 2019-09-29 DIAGNOSIS — Z3A34 34 weeks gestation of pregnancy: Secondary | ICD-10-CM | POA: Diagnosis not present

## 2019-09-29 DIAGNOSIS — O26893 Other specified pregnancy related conditions, third trimester: Principal | ICD-10-CM | POA: Insufficient documentation

## 2019-09-29 DIAGNOSIS — Z79899 Other long term (current) drug therapy: Secondary | ICD-10-CM | POA: Diagnosis not present

## 2019-09-29 DIAGNOSIS — O4193X0 Disorder of amniotic fluid and membranes, unspecified, third trimester, not applicable or unspecified: Secondary | ICD-10-CM | POA: Diagnosis not present

## 2019-09-29 DIAGNOSIS — N898 Other specified noninflammatory disorders of vagina: Secondary | ICD-10-CM | POA: Insufficient documentation

## 2019-09-29 LAB — URINALYSIS, ROUTINE W REFLEX MICROSCOPIC
Bilirubin Urine: NEGATIVE
Glucose, UA: NEGATIVE mg/dL
Hgb urine dipstick: NEGATIVE
Ketones, ur: NEGATIVE mg/dL
Nitrite: NEGATIVE
Protein, ur: NEGATIVE mg/dL
Specific Gravity, Urine: 1.01 (ref 1.005–1.030)
pH: 7 (ref 5.0–8.0)

## 2019-09-29 LAB — RUPTURE OF MEMBRANE (ROM)PLUS: Rom Plus: NEGATIVE

## 2019-09-29 MED ORDER — LACTATED RINGERS IV BOLUS: 1000.0000 mL | Freq: Once | INTRAVENOUS | Status: DC

## 2019-09-29 MED ORDER — NIFEDIPINE 10 MG PO CAPS
10.0000 mg | ORAL_CAPSULE | Freq: Once | ORAL | Status: AC
  Administered 2019-09-29: 20:00:00 10 mg via ORAL
  Filled 2019-09-29: qty 1

## 2019-09-29 MED ORDER — ACETAMINOPHEN 500 MG PO TABS
1000.0000 mg | ORAL_TABLET | Freq: Once | ORAL | Status: AC
  Administered 2019-09-29: 1000 mg via ORAL
  Filled 2019-09-29: qty 2

## 2019-09-29 MED ORDER — LACTATED RINGERS IV SOLN: INTRAVENOUS | Status: DC

## 2019-09-29 MED ORDER — LACTATED RINGERS IV SOLN: 500.0000 mL | INTRAVENOUS | Status: DC | PRN

## 2019-09-29 NOTE — Progress Notes (Signed)
    L&D OB Triage Note  SUBJECTIVE Holly Montgomery is a 19 y.o. G2P0010 female at [redacted]w[redacted]d, EDD Estimated Date of Delivery: 11/04/19 who presented to triage with complaints of leaking of fluid since this morning. Denies bleeding and feels good movement.   OB History  Gravida Para Term Preterm AB Living  2 0 0 0 1 0  SAB TAB Ectopic Multiple Live Births  1 0 0 0 0    # Outcome Date GA Lbr Len/2nd Weight Sex Delivery Anes PTL Lv  2 Current           1 SAB 2019            Medications Prior to Admission  Medication Sig Dispense Refill Last Dose  . Iron-FA-B Cmp-C-Biot-Probiotic (FUSION PLUS) CAPS Take 1 tablet by mouth daily. 30 capsule 6 Past Week at Unknown time  . NIFEdipine (PROCARDIA) 10 MG capsule Take 1 capsule (10 mg total) by mouth every 6 (six) hours as needed (contractions). 30 capsule 0 09/29/2019 at Unknown time  . zolpidem (AMBIEN) 5 MG tablet Take 1 tablet (5 mg total) by mouth at bedtime as needed for sleep. 30 tablet 0 09/28/2019 at Unknown time  . Prenatal Vit-Fe Fumarate-FA (PRENATAL MULTIVITAMIN) TABS tablet Take 1 tablet by mouth daily at 12 noon.   Completed Course at Unknown time  . sertraline (ZOLOFT) 50 MG tablet Take 50 mg by mouth daily.   Not Taking at Unknown time     OBJECTIVE  Nursing Evaluation:   BP 115/71   Pulse 95   Temp 98.2 F (36.8 C) (Oral)   Resp 16   Ht 5\' 4"  (1.626 m)   Wt 57.6 kg   LMP 01/28/2019   BMI 21.80 kg/m    Findings:   ROM plus negative      NST was performed and has been reviewed by me.  NST INTERPRETATION: Category I  Mode: External Baseline Rate (A): 130 bpm Variability: Moderate Accelerations: 15 x 15 Decelerations: None     Contraction Frequency (min): irregular  ASSESSMENT Impression:  1.  Pregnancy:  G2P0010 at [redacted]w[redacted]d , EDD Estimated Date of Delivery: 11/04/19 2.  Reassuring fetal and maternal status 3.  ROM plus negative, intact  PLAN 1. Discussed current condition and above findings with patient and  reassurance given.  All questions answered. 2. Discharge home with standard labor precautions given to return to L&D or call the office for problems. 3. Continue routine prenatal care.    01/04/20, CNM

## 2019-09-29 NOTE — Discharge Instructions (Signed)
LABOR: When contractions begin, you should start to time them from the beginning of one contraction to the beginning of the next.  When contractions are 5-10 minutes apart or less and have been regular for at least an hour, you should call your health care provider.  Notify your doctor if any of the following occur: 1. Bleeding from the vagina 7. Sudden, constant, or occasional abdominal pain  2. Pain or burning when urinating 8. Sudden gushing of fluid from the vagina (with or without continued leaking)  3. Chills or fever 9. Fainting spells, "black outs" or loss of consciousness  4. Increase in vaginal discharge 10. Severe or continued nausea or vomiting  5. Pelvic pressure (sudden increase) 11. Blurring of vision or spots before the eyes  6. Baby moving less than usual 12. Leaking of fluid    FETAL KICK COUNT: Lie on your left side for one hour after a meal, and count the number of times your baby kicks. If it is less than 5 times, get up, move around and drink some juice. Repeat the test 30 minutes later. If it is still less than 5 kicks in an hour, notify your doctor.PRETERM LABOR: Includes any of the following symptoms that occur between 20-[redacted] weeks gestation. If these symptoms are not stopped, preterm labor can result in preterm delivery, placing your baby at risk.  Notify your doctor if any of the following occur: 1. Menstrual-like cramps   5. Pelvic pressure  2. Uterine contractions. These may be painless and feel like the uterus is tightening or the baby is "balling up" 6. Increase or change in vaginal discharge  3. Low, dull backache, unrelieved by heat or Tylenol  7. Vaginal bleeding  4. Intestinal cramps, with our without diarrhea, sometimes 8. A general feeling that "something is not right"   9. Leaking of fluid described as "gas pain"

## 2019-09-29 NOTE — OB Triage Note (Signed)
Pt. presented to L/D with reported leaking of fluid since this morning. It has not filled up a pad, but has been an intermittent trickle. She describes it as clear and watery. No bleeding. Positive fetal movement.  She describes a constant back pain that radiates to her pelvis that she rates 7/10.  No urinary pain. Pressure with urination. Last intercourse 3 days ago. VSS. Monitors applied and assessing.

## 2019-10-04 ENCOUNTER — Ambulatory Visit (INDEPENDENT_AMBULATORY_CARE_PROVIDER_SITE_OTHER): Payer: Medicaid Other | Admitting: Certified Nurse Midwife

## 2019-10-04 ENCOUNTER — Encounter: Payer: Self-pay | Admitting: Certified Nurse Midwife

## 2019-10-04 ENCOUNTER — Encounter: Payer: Medicaid Other | Admitting: Certified Nurse Midwife

## 2019-10-04 ENCOUNTER — Other Ambulatory Visit: Payer: Self-pay

## 2019-10-04 VITALS — BP 108/67 | HR 97 | Wt 129.1 lb

## 2019-10-04 DIAGNOSIS — Z3403 Encounter for supervision of normal first pregnancy, third trimester: Secondary | ICD-10-CM

## 2019-10-04 NOTE — Patient Instructions (Addendum)
Group B Streptococcus Infection During Pregnancy °Group B Streptococcus (GBS) is a type of bacteria that is often found in healthy people. It is commonly found in the rectum, vagina, and intestines. In people who are healthy and not pregnant, the bacteria rarely cause serious illness or complications. However, women who test positive for GBS during pregnancy can pass the bacteria to the baby during childbirth. This can cause serious infection in the baby after birth. °Women with GBS may also have infections during their pregnancy or soon after childbirth. The infections include urinary tract infections (UTIs) or infections of the uterus. GBS also increases a woman's risk of complications during pregnancy, such as early labor or delivery, miscarriage, or stillbirth. Routine testing for GBS is recommended for all pregnant women. °What are the causes? °This condition is caused by bacteria called Streptococcus agalactiae. °What increases the risk? °You may have a higher risk for GBS infection during pregnancy if you had one during a past pregnancy. °What are the signs or symptoms? °In most cases, GBS infection does not cause symptoms in pregnant women. If symptoms exist, they may include: °· Labor that starts before the 37th week of pregnancy. °· A UTI or bladder infection. This may cause a fever, frequent urination, or pain and burning during urination. °· Fever during labor. There can also be a rapid heartbeat in the mother or baby. °Rare but serious symptoms of a GBS infection in women include: °· Blood infection (septicemia). This may cause fever, chills, or confusion. °· Lung infection (pneumonia). This may cause fever, chills, cough, rapid breathing, chest pain, or difficulty breathing. °· Bone, joint, skin, or soft tissue infection. °How is this diagnosed? °You may be screened for GBS between week 35 and week 37 of pregnancy. If you have symptoms of preterm labor, you may be screened earlier. This condition is  diagnosed based on lab test results from: °· A swab of fluid from the vagina and rectum. °· A urine sample. °How is this treated? °This condition is treated with antibiotic medicine. Antibiotic medicine may be given: °· To you when you go into labor, or as soon as your water breaks. The medicines will continue until after you give birth. If you are having a cesarean delivery, you do not need antibiotics unless your water has broken. °· To your baby, if he or she requires treatment. Your health care provider will check your baby to decide if he or she needs antibiotics to prevent a serious infection. °Follow these instructions at home: °· Take over-the-counter and prescription medicines only as told by your health care provider. °· Take your antibiotic medicine as told by your health care provider. Do not stop taking the antibiotic even if you start to feel better. °· Keep all pre-birth (prenatal) visits and follow-up visits as told by your health care provider. This is important. °Contact a health care provider if: °· You have pain or burning when you urinate. °· You have to urinate more often than usual. °· You have a fever or chills. °· You develop a bad-smelling vaginal discharge. °Get help right away if: °· Your water breaks. °· You go into labor. °· You have severe pain in your abdomen. °· You have difficulty breathing. °· You have chest pain. °These symptoms may represent a serious problem that is an emergency. Do not wait to see if the symptoms will go away. Get medical help right away. Call your local emergency services (911 in the [place] Do not drive yourself to   the hospital. Summary  GBS is a type of bacteria that is common in healthy people.  During pregnancy, colonization with GBS can cause serious complications for you or your baby.  Your health care provider will screen you between 35 and 37 weeks of pregnancy to determine if you are colonized with GBS.  If you are colonized with GBS during  pregnancy, your health care provider will recommend antibiotics through an IV during labor.  After delivery, your baby will be evaluated for complications related to potential GBS infection and may require antibiotics to prevent a serious infection. This information is not intended to replace advice given to you by your health care provider. Make sure you discuss any questions you have with your health care provider. Document Revised: 02/14/2019 Document Reviewed: 02/14/2019 Elsevier Patient Education  2020 [employer].  Preterm Labor and Birth Information Pregnancy normally lasts 39-41 weeks. Preterm labor is when labor starts early. It starts before you have been pregnant for 37 whole weeks. What are the risk factors for preterm labor? Preterm labor is more likely to occur in women who:  Have an infection while pregnant.  Have a cervix that is short.  Have gone into preterm labor before.  Have had surgery on their cervix.  Are younger than age 47.  Are older than age 37.  Are African American.  Are pregnant with two or more babies.  Take street drugs while pregnant.  Smoke while pregnant.  Do not gain enough weight while pregnant.  Got pregnant right after another pregnancy. What are the symptoms of preterm labor? Symptoms of preterm labor include:  Cramps. The cramps may feel like the cramps some women get during their period. The cramps may happen with watery poop (diarrhea).  Pain in the belly (abdomen).  Pain in the lower back.  Regular contractions or tightening. It may feel like your belly is getting tighter.  Pressure in the lower belly that seems to get stronger.  More fluid (discharge) leaking from the vagina. The fluid may be watery or bloody.  Water breaking. Why is it important to notice signs of preterm labor? Babies who are born early may not be fully developed. They have a higher chance for:  Long-term heart problems.  Long-term lung  problems.  Trouble controlling body systems, like breathing.  Bleeding in the brain.  A condition called cerebral palsy.  Learning difficulties.  Death. These risks are highest for babies who are born before 34 weeks of pregnancy. How is preterm labor treated? Treatment depends on:  How long you were pregnant.  Your condition.  The health of your baby. Treatment may involve:  Having a stitch (suture) placed in your cervix. When you give birth, your cervix opens so the baby can come out. The stitch keeps the cervix from opening too soon.  Staying at the hospital.  Taking or getting medicines, such as: ? Hormone medicines. ? Medicines to stop contractions. ? Medicines to help the baby's lungs develop. ? Medicines to prevent your baby from having cerebral palsy. What should I do if I am in preterm labor? If you think you are going into labor too soon, call your doctor right away. How can I prevent preterm labor?  Do not use any tobacco products. ? Examples of these are cigarettes, chewing tobacco, and e-cigarettes. ? If you need help quitting, ask your doctor.  Do not use street drugs.  Do not use any medicines unless you ask your doctor if they are safe  for you.  Talk with your doctor before taking any herbal supplements.  Make sure you gain enough weight.  Watch for infection. If you think you might have an infection, get it checked right away.  If you have gone into preterm labor before, tell your doctor. This information is not intended to replace advice given to you by your health care provider. Make sure you discuss any questions you have with your health care provider. Document Revised: 11/12/2018 Document Reviewed: 12/12/2015 Elsevier Patient Education  [place].

## 2019-10-04 NOTE — Progress Notes (Signed)
ROB doing well. Feels good movement. Has irregular contractions. Taking procardia, not working as well as it first did. PTL precautions reviewed. GBS and culture collected. SVE 1-2/70/-2 stataion. Follow up 1 wk with Marcelino Duster.   Doreene Burke, CNM

## 2019-10-04 NOTE — Addendum Note (Signed)
Addended by: Brooke Dare on: 10/04/2019 04:26 PM   Modules accepted: Orders

## 2019-10-05 ENCOUNTER — Telehealth: Payer: Self-pay | Admitting: Certified Nurse Midwife

## 2019-10-05 NOTE — Telephone Encounter (Signed)
Patient sent in an appt request wanting a "weekly visit". She has an appointment the 15th of March. I'm not sure what exactly she is wanting. Should I schedule her for a OB visit?   -TC

## 2019-10-06 ENCOUNTER — Observation Stay
Admission: EM | Admit: 2019-10-06 | Discharge: 2019-10-06 | Disposition: A | Discharge status: Home / Self Care | Payer: Medicaid Other | Attending: Certified Nurse Midwife | Admitting: Certified Nurse Midwife

## 2019-10-06 ENCOUNTER — Other Ambulatory Visit: Payer: Self-pay

## 2019-10-06 ENCOUNTER — Encounter: Payer: Self-pay | Admitting: Obstetrics and Gynecology

## 2019-10-06 DIAGNOSIS — Z3A35 35 weeks gestation of pregnancy: Secondary | ICD-10-CM | POA: Diagnosis not present

## 2019-10-06 DIAGNOSIS — O479 False labor, unspecified: Secondary | ICD-10-CM | POA: Diagnosis present

## 2019-10-06 DIAGNOSIS — O4700 False labor before 37 completed weeks of gestation, unspecified trimester: Secondary | ICD-10-CM | POA: Diagnosis present

## 2019-10-06 LAB — STREP GP B NAA: Strep Gp B NAA: POSITIVE — AB

## 2019-10-06 LAB — GC/CHLAMYDIA PROBE AMP
Chlamydia trachomatis, NAA: NEGATIVE
Neisseria Gonorrhoeae by PCR: NEGATIVE

## 2019-10-06 MED ORDER — ACETAMINOPHEN 500 MG PO TABS
1000.0000 mg | ORAL_TABLET | Freq: Four times a day (QID) | ORAL | Status: DC | PRN
  Administered 2019-10-06: 1000 mg via ORAL
  Filled 2019-10-06: qty 2

## 2019-10-06 MED ORDER — NIFEDIPINE 10 MG PO CAPS
ORAL_CAPSULE | ORAL | Status: AC
  Filled 2019-10-06: qty 1

## 2019-10-06 MED ORDER — NIFEDIPINE 10 MG PO CAPS
10.0000 mg | ORAL_CAPSULE | Freq: Three times a day (TID) | ORAL | Status: DC
  Administered 2019-10-06: 10 mg via ORAL

## 2019-10-06 NOTE — Telephone Encounter (Signed)
If contractions are continuing this frequent, then she needs to be evaluated. We can see her here if she arrived by 4:45, but if not then she needs to go to the hospital for evaluation. JML

## 2019-10-06 NOTE — OB Triage Note (Signed)
Pt presents c/o ctx that started this morning and have progressively got closer and started hurting more. Pt unsure of how far apart ctx are. Denies bleeding or LOF. Reports positive fetal movement. Vitals WNL. Will continue to monitor.

## 2019-10-06 NOTE — Discharge Instructions (Signed)
Braxton Hicks Contractions °Contractions of the uterus can occur throughout pregnancy, but they are not always a sign that you are in labor. You may have practice contractions called Braxton Hicks contractions. These false labor contractions are sometimes confused with true labor. °What are Braxton Hicks contractions? °Braxton Hicks contractions are tightening movements that occur in the muscles of the uterus before labor. Unlike true labor contractions, these contractions do not result in opening (dilation) and thinning of the cervix. Toward the end of pregnancy (32-34 weeks), Braxton Hicks contractions can happen more often and may become stronger. These contractions are sometimes difficult to tell apart from true labor because they can be very uncomfortable. You should not feel embarrassed if you go to the hospital with false labor. °Sometimes, the only way to tell if you are in true labor is for your health care provider to look for changes in the cervix. The health care provider will do a physical exam and may monitor your contractions. If you are not in true labor, the exam should show that your cervix is not dilating and your water has not broken. °If there are no other health problems associated with your pregnancy, it is completely safe for you to be sent home with false labor. You may continue to have Braxton Hicks contractions until you go into true labor. °How to tell the difference between true labor and false labor °True labor °· Contractions last 30-70 seconds. °· Contractions become very regular. °· Discomfort is usually felt in the top of the uterus, and it spreads to the lower abdomen and low back. °· Contractions do not go away with walking. °· Contractions usually become more intense and increase in frequency. °· The cervix dilates and gets thinner. °False labor °· Contractions are usually shorter and not as strong as true labor contractions. °· Contractions are usually irregular. °· Contractions  are often felt in the front of the lower abdomen and in the groin. °· Contractions may go away when you walk around or change positions while lying down. °· Contractions get weaker and are shorter-lasting as time goes on. °· The cervix usually does not dilate or become thin. °Follow these instructions at home: ° °· Take over-the-counter and prescription medicines only as told by your health care provider. °· Keep up with your usual exercises and follow other instructions from your health care provider. °· Eat and drink lightly if you think you are going into labor. °· If Braxton Hicks contractions are making you uncomfortable: °? Change your position from lying down or resting to walking, or change from walking to resting. °? Sit and rest in a tub of warm water. °? Drink enough fluid to keep your urine pale yellow. Dehydration may cause these contractions. °? Do slow and deep breathing several times an hour. °· Keep all follow-up prenatal visits as told by your health care provider. This is important. °Contact a health care provider if: °· You have a fever. °· You have continuous pain in your abdomen. °Get help right away if: °· Your contractions become stronger, more regular, and closer together. °· You have fluid leaking or gushing from your vagina. °· You pass blood-tinged mucus (bloody show). °· You have bleeding from your vagina. °· You have low back pain that you never had before. °· You feel your baby’s head pushing down and causing pelvic pressure. °· Your baby is not moving inside you as much as it used to. °Summary °· Contractions that occur before labor are   called Braxton Hicks contractions, false labor, or practice contractions. °· Braxton Hicks contractions are usually shorter, weaker, farther apart, and less regular than true labor contractions. True labor contractions usually become progressively stronger and regular, and they become more frequent. °· Manage discomfort from Braxton Hicks contractions  by changing position, resting in a warm bath, drinking plenty of water, or practicing deep breathing. °This information is not intended to replace advice given to you by your health care provider. Make sure you discuss any questions you have with your health care provider. °Document Revised: 07/03/2017 Document Reviewed: 12/04/2016 °Elsevier Patient Education © 2020 Elsevier Inc. ° °

## 2019-10-09 NOTE — Discharge Summary (Signed)
Obstetric Discharge Summary  Patient ID: Holly Montgomery MRN: [id number] DOB/AGE: 23-Dec-2000 19 y.o.   Date of Admission: 10/06/2019  Date of Discharge: 10/06/2019  Admitting Diagnosis: Observation at [redacted]w[redacted]d     Discharge Diagnosis: Preterm Contractions   Antepartum Procedures: NST and oral medications   Brief Hospital Course   L&D OB Triage Note  Holly Montgomery is a 19 y.o. G80P0010 female at [redacted]w[redacted]d, EDD Estimated Date of Delivery: 11/04/19 who presented to triage for complaints of painful, regular contractions. She was evaluated by the nurses with no significant findings for preterm labor or fetal distress. Vital signs stable. An NST was performed and has been reviewed by CNM. She was treated with oral medications, see MAR.   NST INTERPRETATION: Indications: rule out uterine contractions  Mode: External Baseline Rate (A): 135 bpm(fht) Variability: Moderate Accelerations: 15 x 15 Decelerations: None Contraction Frequency (min): occasional  Impression: reactive   Plan: NST performed was reviewed and was found to be reactive. She was discharged home with bleeding/labor precautions.  Continue routine prenatal care. Follow up with midwife as previously scheduled.    Discharge Instructions: Per After Visit Summary. Activity: Advance as tolerated. Pelvic rest for 6 weeks.  Also refer to After Visit Summary Diet: Regular Medications: Allergies as of 10/06/2019      Reactions   Motrin [ibuprofen] Hives   Latex Rash      Medication List    ASK your doctor about these medications   Fusion Plus Caps Take 1 tablet by mouth daily.   NIFEdipine 10 MG capsule Commonly known as: PROCARDIA Take 1 capsule (10 mg total) by mouth every 6 (six) hours as needed (contractions).   prenatal multivitamin Tabs tablet Take 1 tablet by mouth daily at 12 noon.   sertraline 50 MG tablet Commonly known as: ZOLOFT Take 50 mg by mouth daily.   zolpidem 5 MG tablet Commonly known as: AMBIEN Take 1  tablet (5 mg total) by mouth at bedtime as needed for sleep.      Outpatient follow up:  Follow-up Information    [facility] Follow up.   Why: Please keep appointments as previously scheduled  Contact information: [address]  [address] [place] [address] [phone]         Postpartum contraception: Depo-Provera  Discharged Condition: stable  Discharged to: home   Gunnar Bulla, CNM [facility], [facility]

## 2019-10-10 ENCOUNTER — Encounter: Payer: Medicaid Other | Admitting: Certified Nurse Midwife

## 2019-10-12 ENCOUNTER — Other Ambulatory Visit: Payer: Self-pay

## 2019-10-12 ENCOUNTER — Observation Stay
Admission: EM | Admit: 2019-10-12 | Discharge: 2019-10-12 | Disposition: A | Discharge status: Home / Self Care | Payer: Medicaid Other | Attending: Certified Nurse Midwife | Admitting: Certified Nurse Midwife

## 2019-10-12 ENCOUNTER — Encounter: Payer: Self-pay | Admitting: Obstetrics and Gynecology

## 2019-10-12 DIAGNOSIS — O4703 False labor before 37 completed weeks of gestation, third trimester: Principal | ICD-10-CM | POA: Insufficient documentation

## 2019-10-12 DIAGNOSIS — Z3A36 36 weeks gestation of pregnancy: Secondary | ICD-10-CM

## 2019-10-12 DIAGNOSIS — O418X30 Other specified disorders of amniotic fluid and membranes, third trimester, not applicable or unspecified: Secondary | ICD-10-CM

## 2019-10-12 LAB — RUPTURE OF MEMBRANE (ROM)PLUS: Rom Plus: NEGATIVE

## 2019-10-12 NOTE — OB Triage Note (Signed)
Pt reports that at 2 am this morning she had fluid leaking that had a sweet smell. And ctx that are every 4-6 mins

## 2019-10-12 NOTE — OB Triage Note (Signed)
    L&D OB Triage Note  SUBJECTIVE Holly Montgomery is a 19 y.o. G94P0010 female at [redacted]w[redacted]d, EDD Estimated Date of Delivery: 11/04/19 who presented to triage with complaints of leaking of fluid and contractions. Feels good movement.   OB History  Gravida Para Term Preterm AB Living  2 0 0 0 1 0  SAB TAB Ectopic Multiple Live Births  1 0 0 0 0    # Outcome Date GA Lbr Len/2nd Weight Sex Delivery Anes PTL Lv  2 Current           1 SAB 2019            Medications Prior to Admission  Medication Sig Dispense Refill Last Dose  . Iron-FA-B Cmp-C-Biot-Probiotic (FUSION PLUS) CAPS Take 1 tablet by mouth daily. 30 capsule 6   . NIFEdipine (PROCARDIA) 10 MG capsule Take 1 capsule (10 mg total) by mouth every 6 (six) hours as needed (contractions). 30 capsule 0   . Prenatal Vit-Fe Fumarate-FA (PRENATAL MULTIVITAMIN) TABS tablet Take 1 tablet by mouth daily at 12 noon.     . sertraline (ZOLOFT) 50 MG tablet Take 50 mg by mouth daily.     Marland Kitchen zolpidem (AMBIEN) 5 MG tablet Take 1 tablet (5 mg total) by mouth at bedtime as needed for sleep. 30 tablet 0      OBJECTIVE  Nursing Evaluation:   BP (!) 92/57   Pulse (!) 106   Temp 98.2 F (36.8 C) (Oral)   Resp 12   LMP 01/28/2019    Findings:   Membranes intact, reactive nst, false labor       NST was performed and has been reviewed by me.  NST INTERPRETATION: Category I  Mode: External Baseline Rate (A): 120 bpm Variability: Moderate Accelerations: 15 x 15 Decelerations: None     Contraction Frequency (min): 1.5-3  ASSESSMENT Impression:  1.  Pregnancy:  G2P0010 at [redacted]w[redacted]d , EDD Estimated Date of Delivery: 11/04/19 2.  Reassuring fetal and maternal status 3.  No cervical change in 4 hrs.   PLAN 1. Discussed current condition and above findings with patient and reassurance given.  All questions answered. 2. Discharge home with standard labor precautions given to return to L&D or call the office for problems. 3. Continue routine prenatal  care.  Doreene Burke, CNM

## 2019-10-12 NOTE — Progress Notes (Signed)
Patient is stable and VS within normal limits. Fetal Heart Rate monitored for an adequate amount of time. Patient informed to follow up with primary OB doctor within 24 hours.   

## 2019-10-13 ENCOUNTER — Encounter: Payer: Medicaid Other | Admitting: Certified Nurse Midwife

## 2019-10-13 ENCOUNTER — Ambulatory Visit (INDEPENDENT_AMBULATORY_CARE_PROVIDER_SITE_OTHER): Payer: Medicaid Other | Admitting: Certified Nurse Midwife

## 2019-10-13 VITALS — BP 98/63 | HR 86 | Wt 127.0 lb

## 2019-10-13 DIAGNOSIS — Z3493 Encounter for supervision of normal pregnancy, unspecified, third trimester: Secondary | ICD-10-CM

## 2019-10-13 DIAGNOSIS — Z3A36 36 weeks gestation of pregnancy: Secondary | ICD-10-CM

## 2019-10-13 DIAGNOSIS — O219 Vomiting of pregnancy, unspecified: Secondary | ICD-10-CM

## 2019-10-13 LAB — POCT URINALYSIS DIPSTICK OB
Bilirubin, UA: NEGATIVE
Blood, UA: NEGATIVE
Glucose, UA: NEGATIVE
Ketones, UA: NEGATIVE
Nitrite, UA: NEGATIVE
POC,PROTEIN,UA: NEGATIVE
Spec Grav, UA: 1.015 (ref 1.010–1.025)
Urobilinogen, UA: 0.2 E.U./dL
pH, UA: 6.5 (ref 5.0–8.0)

## 2019-10-13 MED ORDER — ONDANSETRON 4 MG PO TBDP: 4.0000 mg | ORAL_TABLET | Freq: Four times a day (QID) | ORAL | 0 refills | Status: DC | PRN

## 2019-10-13 NOTE — Patient Instructions (Signed)
Vaginal Delivery  Vaginal delivery means that you give birth by pushing your baby out of your birth canal (vagina). A team of health care providers will help you before, during, and after vaginal delivery. Birth experiences are unique for every woman and every pregnancy, and birth experiences vary depending on where you choose to give birth. What happens when I arrive at the birth center or hospital? Once you are in labor and have been admitted into the hospital or birth center, your health care provider may:  Review your pregnancy history and any concerns that you have.  Insert an IV into one of your veins. This may be used to give you fluids and medicines.  Check your blood pressure, pulse, temperature, and heart rate (vital signs).  Check whether your bag of water (amniotic sac) has broken (ruptured).  Talk with you about your birth plan and discuss pain control options. Monitoring Your health care provider may monitor your contractions (uterine monitoring) and your baby's heart rate (fetal monitoring). You may need to be monitored:  Often, but not continuously (intermittently).  All the time or for long periods at a time (continuously). Continuous monitoring may be needed if: ? You are taking certain medicines, such as medicine to relieve pain or make your contractions stronger. ? You have pregnancy or labor complications. Monitoring may be done by:  Placing a special stethoscope or a handheld monitoring device on your abdomen to check your baby's heartbeat and to check for contractions.  Placing monitors on your abdomen (external monitors) to record your baby's heartbeat and the frequency and length of contractions.  Placing monitors inside your uterus through your vagina (internal monitors) to record your baby's heartbeat and the frequency, length, and strength of your contractions. Depending on the type of monitor, it may remain in your uterus or on your baby's head until  birth.  Telemetry. This is a type of continuous monitoring that can be done with external or internal monitors. Instead of having to stay in bed, you are able to move around during telemetry. Physical exam Your health care provider may perform frequent physical exams. This may include:  Checking how and where your baby is positioned in your uterus.  Checking your cervix to determine: ? Whether it is thinning out (effacing). ? Whether it is opening up (dilating). What happens during labor and delivery?  Normal labor and delivery is divided into the following three stages: Stage 1  This is the longest stage of labor.  This stage can last for hours or days.  Throughout this stage, you will feel contractions. Contractions generally feel mild, infrequent, and irregular at first. They get stronger, more frequent (about every 2-3 minutes), and more regular as you move through this stage.  This stage ends when your cervix is completely dilated to 4 inches (10 cm) and completely effaced. Stage 2  This stage starts once your cervix is completely effaced and dilated and lasts until the delivery of your baby.  This stage may last from 20 minutes to 2 hours.  This is the stage where you will feel an urge to push your baby out of your vagina.  You may feel stretching and burning pain, especially when the widest part of your baby's head passes through the vaginal opening (crowning).  Once your baby is delivered, the umbilical cord will be clamped and cut. This usually occurs after waiting a period of 1-2 minutes after delivery.  Your baby will be placed on your bare chest (  skin-to-skin contact) in an upright position and covered with a warm blanket. Watch your baby for feeding cues, like rooting or sucking, and help the baby to your breast for his or her first feeding. Stage 3  This stage starts immediately after the birth of your baby and ends after you deliver the placenta.  This stage may  take anywhere from 5 to 30 minutes.  After your baby has been delivered, you will feel contractions as your body expels the placenta and your uterus contracts to control bleeding. What can I expect after labor and delivery?  After labor is over, you and your baby will be monitored closely until you are ready to go home to ensure that you are both healthy. Your health care team will teach you how to care for yourself and your baby.  You and your baby will stay in the same room (rooming in) during your hospital stay. This will encourage early bonding and successful breastfeeding.  You may continue to receive fluids and medicines through an IV.  Your uterus will be checked and massaged regularly (fundal massage).  You will have some soreness and pain in your abdomen, vagina, and the area of skin between your vaginal opening and your anus (perineum).  If an incision was made near your vagina (episiotomy) or if you had some vaginal tearing during delivery, cold compresses may be placed on your episiotomy or your tear. This helps to reduce pain and swelling.  You may be given a squirt bottle to use instead of wiping when you go to the bathroom. To use the squirt bottle, follow these steps: ? Before you urinate, fill the squirt bottle with warm water. Do not use hot water. ? After you urinate, while you are sitting on the toilet, use the squirt bottle to rinse the area around your urethra and vaginal opening. This rinses away any urine and blood. ? Fill the squirt bottle with clean water every time you use the bathroom.  It is normal to have vaginal bleeding after delivery. Wear a sanitary pad for vaginal bleeding and discharge. Summary  Vaginal delivery means that you will give birth by pushing your baby out of your birth canal (vagina).  Your health care provider may monitor your contractions (uterine monitoring) and your baby's heart rate (fetal monitoring).  Your health care provider may  perform a physical exam.  Normal labor and delivery is divided into three stages.  After labor is over, you and your baby will be monitored closely until you are ready to go home. This information is not intended to replace advice given to you by your health care provider. Make sure you discuss any questions you have with your health care provider. Document Revised: 08/25/2017 Document Reviewed: 08/25/2017 Elsevier Patient Education  2020 Else[employer]    Fetal Movement Counts Patient Name: ________________________________________________ Patient Due Date: ____________________ What is a fetal movement count?  A fetal movement count is the number of times that you feel your baby move during a certain amount of time. This may also be called a fetal kick count. A fetal movement count is recommended for every pregnant woman. You may be asked to start counting fetal movements as early as week 28 of your pregnancy. Pay attention to when your baby is most active. You may notice your baby's sleep and wake cycles. You may also notice things that make your baby move more. You should do a fetal movement count:  When your baby is normally most   active.  At the same time each day. A good time to count movements is while you are resting, after having something to eat and drink. How do I count fetal movements? 1. Find a quiet, comfortable area. Sit, or lie down on your side. 2. Write down the date, the start time and stop time, and the number of movements that you felt between those two times. Take this information with you to your health care visits. 3. Write down your start time when you feel the first movement. 4. Count kicks, flutters, swishes, rolls, and jabs. You should feel at least 10 movements. 5. You may stop counting after you have felt 10 movements, or if you have been counting for 2 hours. Write down the stop time. 6. If you do not feel 10 movements in 2 hours, contact your health care  provider for further instructions. Your health care provider may want to do additional tests to assess your baby's well-being. Contact a health care provider if:  You feel fewer than 10 movements in 2 hours.  Your baby is not moving like he or she usually does. Date: ____________ Start time: ____________ Stop time: ____________ Movements: ____________ Date: ____________ Start time: ____________ Stop time: ____________ Movements: ____________ Date: ____________ Start time: ____________ Stop time: ____________ Movements: ____________ Date: ____________ Start time: ____________ Stop time: ____________ Movements: ____________ Date: ____________ Start time: ____________ Stop time: ____________ Movements: ____________ Date: ____________ Start time: ____________ Stop time: ____________ Movements: ____________ Date: ____________ Start time: ____________ Stop time: ____________ Movements: ____________ Date: ____________ Start time: ____________ Stop time: ____________ Movements: ____________ Date: ____________ Start time: ____________ Stop time: ____________ Movements: ____________ This information is not intended to replace advice given to you by your health care provider. Make sure you discuss any questions you have with your health care provider. Document Revised: 03/10/2019 Document Reviewed: 03/10/2019 Elsevier Patient Education  2020 Elsevier Inc.  

## 2019-10-13 NOTE — Progress Notes (Signed)
ROB-Patient c/o intermittent UC's, vomiting after eating x5 days, vaginal pain and pressure x2 days. No fever.

## 2019-10-14 ENCOUNTER — Observation Stay
Admission: EM | Admit: 2019-10-14 | Discharge: 2019-10-14 | Disposition: A | Discharge status: Home / Self Care | Payer: Medicaid Other | Attending: Certified Nurse Midwife | Admitting: Certified Nurse Midwife

## 2019-10-14 ENCOUNTER — Encounter: Payer: Self-pay | Admitting: Obstetrics and Gynecology

## 2019-10-14 DIAGNOSIS — O471 False labor at or after 37 completed weeks of gestation: Secondary | ICD-10-CM | POA: Diagnosis present

## 2019-10-14 DIAGNOSIS — Z886 Allergy status to analgesic agent status: Secondary | ICD-10-CM | POA: Insufficient documentation

## 2019-10-14 DIAGNOSIS — Z79899 Other long term (current) drug therapy: Secondary | ICD-10-CM | POA: Insufficient documentation

## 2019-10-14 DIAGNOSIS — Z3A37 37 weeks gestation of pregnancy: Secondary | ICD-10-CM

## 2019-10-14 DIAGNOSIS — Z91040 Latex allergy status: Secondary | ICD-10-CM | POA: Diagnosis not present

## 2019-10-14 NOTE — Progress Notes (Signed)
Pt ambulated for one hour, placed back on monitor, Tracing reactive, SVE repeated with no change in cervix, Provider Willodean Rosenthal notified, discharge order received. Pt. Given labor precaution and instructions to call or return to LD for any concerns.

## 2019-10-14 NOTE — Progress Notes (Signed)
ROB-Reports vomiting after meals for the last five (5) days; last attempted to eat cheesecake. Notes increased vaginal pain and pressure with irregular uterine contraction. Discussed home treatment measures. Rx Zofran, see orders. Advised to stop Procardia tomorrow. Anticipatory guidance regarding course of prenatal care. Encouraged to get CBC next visit to check anemia. Reviewed red flag symptoms and when to call. RTC x 1 week for ROB or sooner if needed.

## 2019-10-14 NOTE — Progress Notes (Signed)
Order received to perform vaginal exam, cervix unchanged from previous exam 2 days ago. Pt to ambulate for an hour and will recheck per provider order.

## 2019-10-14 NOTE — OB Triage Note (Signed)
Presents with complaint of contractions that started around 5 am this morning. States that she took a tylenol pm and went back to sleep but the contractions kept waking her up. Stopped taking Procardia yesterday.  Denies leaking of fluid, notes a small amount of bloody show on tissue after using bathroom.

## 2019-10-15 NOTE — Discharge Summary (Signed)
Obstetric Discharge Summary  Patient ID: Holly Montgomery MRN: [id number] DOB/AGE: 2000/11/16 18 y.o.   Date of Admission: 10/14/2019  Date of Discharge: 10/14/2019  Admitting Diagnosis: Observation at [redacted]w[redacted]d     Discharge Diagnosis: No other diagnosis   Antepartum Procedures: NST    Brief Hospital Course   L&D OB Triage Note  Holly Montgomery is a 19 y.o. G73P0010 female at [redacted]w[redacted]d, EDD Estimated Date of Delivery: 11/04/19 who presented to triage for complaints of uterine contractions, stopped Procardia yesterday.  She was evaluated by the nurses with no significant findings for active labor or fetal distress. Vital signs stable. An NST was performed and has been reviewed by CNM.   NST INTERPRETATION:  Indications: rule out uterine contractions  Mode: External Baseline Rate (A): 140 bpm Variability: Moderate Accelerations: 15 x 15 Decelerations: None     Contraction Frequency (min): 7-8  Impression: reactive  Dilation: 3 Effacement (%): 70 Cervical Position: Middle Station: -2 Presentation: Vertex Exam by:: gck  Plan: NST performed was reviewed and was found to be reactive. She was discharged home with bleeding/labor precautions.  Continue routine prenatal care. Follow up with CNM as previously scheduled.    Discharge Instructions: Per After Visit Summary.  Activity: Also refer to After Visit Summary.  Diet: Regular  Medications: Allergies as of 10/14/2019      Reactions   Motrin [ibuprofen] Hives   Latex Rash      Medication List    ASK your doctor about these medications   Fusion Plus Caps Take 1 tablet by mouth daily.   NIFEdipine 10 MG capsule Commonly known as: PROCARDIA Take 1 capsule (10 mg total) by mouth every 6 (six) hours as needed (contractions).   ondansetron 4 MG disintegrating tablet Commonly known as: Zofran ODT Take 1 tablet (4 mg total) by mouth every 6 (six) hours as needed for nausea.   prenatal multivitamin Tabs tablet Take 1 tablet  by mouth daily at 12 noon.   sertraline 50 MG tablet Commonly known as: ZOLOFT Take 50 mg by mouth daily.   zolpidem 5 MG tablet Commonly known as: AMBIEN Take 1 tablet (5 mg total) by mouth at bedtime as needed for sleep.      Outpatient follow up:  Follow-up Information    [facility] Follow up.   Why: keep regular appointment   Contact information: [address]  [address] [place] [address] [phone]         Postpartum contraception: Depo-Provera  Discharged Condition: stable  Discharged to: home   Gunnar Bulla, CNM [facility], [facility]

## 2019-10-17 ENCOUNTER — Encounter: Payer: Medicaid Other | Admitting: Certified Nurse Midwife

## 2019-10-18 ENCOUNTER — Encounter: Payer: Self-pay | Admitting: Obstetrics and Gynecology

## 2019-10-18 ENCOUNTER — Inpatient Hospital Stay
Admission: EM | Admit: 2019-10-18 | Discharge: 2019-10-20 | DRG: 807 | Disposition: A | Discharge status: Home / Self Care | Payer: Medicaid Other | Attending: Certified Nurse Midwife | Admitting: Certified Nurse Midwife

## 2019-10-18 ENCOUNTER — Telehealth: Payer: Self-pay

## 2019-10-18 ENCOUNTER — Telehealth: Payer: Self-pay | Admitting: Certified Nurse Midwife

## 2019-10-18 ENCOUNTER — Inpatient Hospital Stay: Payer: Medicaid Other | Admitting: Anesthesiology

## 2019-10-18 ENCOUNTER — Other Ambulatory Visit: Payer: Self-pay

## 2019-10-18 DIAGNOSIS — D649 Anemia, unspecified: Secondary | ICD-10-CM | POA: Diagnosis present

## 2019-10-18 DIAGNOSIS — O99824 Streptococcus B carrier state complicating childbirth: Secondary | ICD-10-CM | POA: Diagnosis present

## 2019-10-18 DIAGNOSIS — Z20822 Contact with and (suspected) exposure to covid-19: Secondary | ICD-10-CM | POA: Diagnosis present

## 2019-10-18 DIAGNOSIS — Z3A37 37 weeks gestation of pregnancy: Secondary | ICD-10-CM | POA: Diagnosis not present

## 2019-10-18 DIAGNOSIS — O9902 Anemia complicating childbirth: Secondary | ICD-10-CM | POA: Diagnosis present

## 2019-10-18 DIAGNOSIS — O26893 Other specified pregnancy related conditions, third trimester: Secondary | ICD-10-CM | POA: Diagnosis present

## 2019-10-18 LAB — CBC
HCT: 35.4 % — ABNORMAL LOW (ref 36.0–46.0)
Hemoglobin: 11.2 g/dL — ABNORMAL LOW (ref 12.0–15.0)
MCH: 26.9 pg (ref 26.0–34.0)
MCHC: 31.6 g/dL (ref 30.0–36.0)
MCV: 85.1 fL (ref 80.0–100.0)
Platelets: 254 10*3/uL (ref 150–400)
RBC: 4.16 MIL/uL (ref 3.87–5.11)
RDW: 13.4 % (ref 11.5–15.5)
WBC: 12.8 10*3/uL — ABNORMAL HIGH (ref 4.0–10.5)
nRBC: 0 % (ref 0.0–0.2)

## 2019-10-18 LAB — RESPIRATORY PANEL BY RT PCR (FLU A&B, COVID)
Influenza A by PCR: NEGATIVE
Influenza B by PCR: NEGATIVE
SARS Coronavirus 2 by RT PCR: NEGATIVE

## 2019-10-18 LAB — TYPE AND SCREEN
ABO/RH(D): O NEG
Antibody Screen: POSITIVE

## 2019-10-18 MED ORDER — DIPHENHYDRAMINE HCL 50 MG/ML IJ SOLN: 12.5000 mg | INTRAMUSCULAR | Status: DC | PRN

## 2019-10-18 MED ORDER — LACTATED RINGERS IV SOLN: INTRAVENOUS | Status: DC

## 2019-10-18 MED ORDER — ONDANSETRON HCL 4 MG/2ML IJ SOLN: 4.0000 mg | Freq: Four times a day (QID) | INTRAMUSCULAR | Status: DC | PRN

## 2019-10-18 MED ORDER — PHENYLEPHRINE 40 MCG/ML (10ML) SYRINGE FOR IV PUSH (FOR BLOOD PRESSURE SUPPORT)
80.0000 ug | PREFILLED_SYRINGE | INTRAVENOUS | Status: DC | PRN
  Filled 2019-10-18: qty 10

## 2019-10-18 MED ORDER — LIDOCAINE HCL (PF) 1 % IJ SOLN
INTRAMUSCULAR | Status: DC | PRN
  Administered 2019-10-18: 1 mL

## 2019-10-18 MED ORDER — LIDOCAINE HCL (PF) 1 % IJ SOLN: 30.0000 mL | INTRAMUSCULAR | Status: DC | PRN

## 2019-10-18 MED ORDER — OXYTOCIN 40 UNITS IN NORMAL SALINE INFUSION - SIMPLE MED
2.5000 [IU]/h | INTRAVENOUS | Status: DC
  Filled 2019-10-18: qty 1000

## 2019-10-18 MED ORDER — LACTATED RINGERS IV SOLN
500.0000 mL | Freq: Once | INTRAVENOUS | Status: AC
  Administered 2019-10-18: 500 mL via INTRAVENOUS

## 2019-10-18 MED ORDER — SODIUM CHLORIDE 0.9 % IV SOLN
INTRAVENOUS | Status: DC | PRN
  Administered 2019-10-18 (×3): 5 mL via EPIDURAL

## 2019-10-18 MED ORDER — BUTORPHANOL TARTRATE 1 MG/ML IJ SOLN
1.0000 mg | INTRAMUSCULAR | Status: DC | PRN
  Administered 2019-10-18: 1 mg via INTRAVENOUS
  Filled 2019-10-18: qty 1

## 2019-10-18 MED ORDER — FENTANYL 2.5 MCG/ML W/ROPIVACAINE 0.15% IN NS 100 ML EPIDURAL (ARMC): 12.0000 mL/h | EPIDURAL | Status: DC

## 2019-10-18 MED ORDER — FENTANYL 2.5 MCG/ML W/ROPIVACAINE 0.15% IN NS 100 ML EPIDURAL (ARMC)
EPIDURAL | Status: DC | PRN
  Administered 2019-10-18: 12 mL/h via EPIDURAL

## 2019-10-18 MED ORDER — LACTATED RINGERS IV SOLN
500.0000 mL | INTRAVENOUS | Status: DC | PRN
  Administered 2019-10-18: 1000 mL via INTRAVENOUS

## 2019-10-18 MED ORDER — PENICILLIN G 3 MILLION UNITS IVPB - SIMPLE MED
3.0000 10*6.[IU] | INTRAVENOUS | Status: DC
  Administered 2019-10-19: 3 10*6.[IU] via INTRAVENOUS
  Filled 2019-10-18: qty 100

## 2019-10-18 MED ORDER — EPHEDRINE 5 MG/ML INJ
10.0000 mg | INTRAVENOUS | Status: DC | PRN
  Administered 2019-10-18: 10 mg via INTRAVENOUS
  Filled 2019-10-18: qty 2

## 2019-10-18 MED ORDER — SODIUM CHLORIDE 0.9 % IV SOLN
5.0000 10*6.[IU] | Freq: Once | INTRAVENOUS | Status: AC
  Administered 2019-10-18: 5 10*6.[IU] via INTRAVENOUS
  Filled 2019-10-18: qty 5

## 2019-10-18 MED ORDER — OXYTOCIN BOLUS FROM INFUSION: 500.0000 mL | Freq: Once | INTRAVENOUS | Status: DC

## 2019-10-18 MED ORDER — FENTANYL 2.5 MCG/ML W/ROPIVACAINE 0.15% IN NS 100 ML EPIDURAL (ARMC)
EPIDURAL | Status: AC
  Administered 2019-10-18: 250 ug
  Filled 2019-10-18: qty 100

## 2019-10-18 MED ORDER — ACETAMINOPHEN 325 MG PO TABS: 650.0000 mg | ORAL_TABLET | ORAL | Status: DC | PRN

## 2019-10-18 MED ORDER — EPHEDRINE 5 MG/ML INJ
10.0000 mg | INTRAVENOUS | Status: DC | PRN
  Administered 2019-10-19: 10 mg via INTRAVENOUS
  Filled 2019-10-18: qty 2
  Filled 2019-10-18: qty 4

## 2019-10-18 NOTE — OB Triage Note (Signed)
Pt is 18 y.o G2P0010 at [redacted]w[redacted]d presents to the ED c/o contractions that started earlier this morning but increased in intensity and frequency at around 1600.Pt also reports a moderate amount of vaginal bleeding. Scant amount present on pad upon arrival to L&D. Pt states positive fetal movement and denies leaking of fluid. External monitors applied and assessing. Initial FHT 130. VSS. SVE 3.5/70/-2. A.Janee Morn, CNM notified and aware of pt. Orders for 2hr labor evaluation, take pt of the monitors and allow pt to ambulate and recheck cervix in 2hrs. Report given to oncoming RN J.Daley.

## 2019-10-18 NOTE — Telephone Encounter (Signed)
This patient called in saying she was having period like cramps with what looks like  discharge with a yellow tint. Patient denies any bleeding and nausea. Sending to you because Inge Rise and Marcelino Duster are out today. Could you please advise this patient?

## 2019-10-18 NOTE — Anesthesia Procedure Notes (Signed)
Epidural Patient location during procedure: OB Start time: 10/18/2019 10:20 PM End time: 10/18/2019 10:32 PM  Staffing Anesthesiologist: Karleen Hampshire, MD Performed: anesthesiologist   Preanesthetic Checklist Completed: patient identified, IV checked, site marked, risks and benefits discussed, surgical consent, monitors and equipment checked, pre-op evaluation and timeout performed  Epidural Patient position: sitting Prep: ChloraPrep Patient monitoring: heart rate, continuous pulse ox and blood pressure Approach: midline Location: L4-L5 Injection technique: LOR saline  Needle:  Needle type: Tuohy  Needle gauge: 18 G Needle length: 9 cm and 9 Needle insertion depth: 6 cm Catheter type: closed end flexible Catheter size: 20 Guage Catheter at skin depth: 10 cm Test dose: negative and Other  Assessment Events: blood not aspirated, injection not painful, no injection resistance, no paresthesia and negative IV test  Additional Notes Risks and benefits of procedure discussed with patient.  Risks including but not limited to infection, spinal/epidural hematoma, nerve injury, post dural puncture headache, and inadequate/failed block.  Patient expressed understanding and consented to epidural placement. Negative dural puncture.  Negative aspiration.  Negative paresthesia on injection.  Dose given in divided aliquots.  Patient tolerated the procedure well with no immediate complications.   Reason for block:procedure for pain

## 2019-10-18 NOTE — Telephone Encounter (Signed)
mychart message sent to patient in response to telephone call received.

## 2019-10-18 NOTE — Anesthesia Preprocedure Evaluation (Addendum)
Anesthesia Evaluation  Patient identified by MRN, date of birth, ID band Patient awake    Reviewed: Allergy & Precautions, H&P , NPO status , Patient's Chart, lab work & pertinent test results  Airway Mallampati: II  TM Distance: >3 FB Neck ROM: full    Dental  (+) Teeth Intact   Pulmonary neg pulmonary ROS,           Cardiovascular Exercise Tolerance: Good (-) hypertensionnegative cardio ROS       Neuro/Psych PSYCHIATRIC DISORDERS Depression    GI/Hepatic negative GI ROS,   Endo/Other    Renal/GU   negative genitourinary   Musculoskeletal   Abdominal   Peds  Hematology  (+) Blood dyscrasia, anemia ,   Anesthesia Other Findings Past Medical History: No date: Anemia No date: Depression  Past Surgical History: No date: NO PAST SURGERIES     Reproductive/Obstetrics (+) Pregnancy                            Anesthesia Physical Anesthesia Plan  ASA: II  Anesthesia Plan: Epidural   Post-op Pain Management:    Induction:   PONV Risk Score and Plan:   Airway Management Planned:   Additional Equipment:   Intra-op Plan:   Post-operative Plan:   Informed Consent: I have reviewed the patients History and Physical, chart, labs and discussed the procedure including the risks, benefits and alternatives for the proposed anesthesia with the patient or authorized representative who has indicated his/her understanding and acceptance.       Plan Discussed with: Anesthesiologist  Anesthesia Plan Comments:         Anesthesia Quick Evaluation

## 2019-10-18 NOTE — H&P (Signed)
History and Physical   HPI  Holly Montgomery is a 19 y.o. G2P0010 at [redacted]w[redacted]d Estimated Date of Delivery: 11/04/19 who is being admitted for labor management.   OB History  OB History  Gravida Para Term Preterm AB Living  2 0 0 0 1 0  SAB TAB Ectopic Multiple Live Births  1 0 0 0 0    # Outcome Date GA Lbr Len/2nd Weight Sex Delivery Anes PTL Lv  2 Current           1 SAB 2019            PROBLEM LIST  Pregnancy complications or risks: Patient Active Problem List   Diagnosis Date Noted  . Labor and delivery, indication for care 10/18/2019  . Indication for care in labor or delivery 10/14/2019  . Indication for care in labor and delivery, antepartum 10/14/2019  . Preterm contractions 10/06/2019    Prenatal labs and studies: ABO, Rh: --/--/PENDING (03/16 2000) Antibody: PENDING (03/16 2000) Rubella: Immune (09/29 0000) RPR: Non Reactive (01/11 1125)  HBsAg: Negative (09/29 0000)  HIV: Non-reactive (09/29 0000)  GBS:--/Positive (03/02 1641)   Past Medical History:  Diagnosis Date  . Anemia   . Depression      Past Surgical History:  Procedure Laterality Date  . NO PAST SURGERIES       Medications    Current Discharge Medication List    CONTINUE these medications which have NOT CHANGED   Details  ondansetron (ZOFRAN ODT) 4 MG disintegrating tablet Take 1 tablet (4 mg total) by mouth every 6 (six) hours as needed for nausea. Qty: 20 tablet, Refills: 0    Prenatal Vit-Fe Fumarate-FA (PRENATAL MULTIVITAMIN) TABS tablet Take 1 tablet by mouth daily at 12 noon.    zolpidem (AMBIEN) 5 MG tablet Take 1 tablet (5 mg total) by mouth at bedtime as needed for sleep. Qty: 30 tablet, Refills: 0    Iron-FA-B Cmp-C-Biot-Probiotic (FUSION PLUS) CAPS Take 1 tablet by mouth daily. Qty: 30 capsule, Refills: 6    NIFEdipine (PROCARDIA) 10 MG capsule Take 1 capsule (10 mg total) by mouth every 6 (six) hours as needed (contractions). Qty: 30 capsule, Refills: 0      sertraline (ZOLOFT) 50 MG tablet Take 50 mg by mouth daily.         Allergies  Motrin [ibuprofen] and Latex  Review of Systems  Constitutional: negative Eyes: negative Ears, nose, mouth, throat, and face: negative Respiratory: negative Cardiovascular: negative Gastrointestinal: negative Genitourinary:negative Integument/breast: negative Hematologic/lymphatic: negative Musculoskeletal:negative Neurological: negative Behavioral/Psych: negative Endocrine: negative Allergic/Immunologic: negative  Physical Exam  BP 108/65   Pulse 98   Temp 98.5 F (36.9 C) (Oral)   Resp 18   LMP 01/28/2019   Lungs:  CTA B Cardio: RRR Abd: Soft, gravid, NT Presentation: cephalic EXT: No C/C/ 1+ Edema DTRs: 2+ B CERVIX: 5/100/-1  See Prenatal records for more detailed PE.     FHR:  Baseline: 130 bpm, Variability: Good {> 6 bpm), Accelerations: Reactive and Decelerations: Absent  Toco: Uterine Contractions: Frequency: Every 2-3 minutes, Duration: 40-80 seconds and Intensity: moderate  Test Results  Results for orders placed or performed during the hospital encounter of 10/18/19 (from the past 24 hour(s))  Type and screen [place]     Status: None (Preliminary result)   Collection Time: 10/18/19  8:00 PM  Result Value Ref Range   ABO/RH(D) PENDING    Antibody Screen PENDING    Sample Expiration  10/21/2019,2359 Performed at [facility], [address] [place], [place] [address]   CBC     Status: Abnormal   Collection Time: 10/18/19  8:02 PM  Result Value Ref Range   WBC 12.8 (H) 4.0 - 10.5 K/uL   RBC 4.16 3.87 - 5.11 MIL/uL   Hemoglobin 11.2 (L) 12.0 - 15.0 g/dL   HCT 03.7 (L) 95.5 - 83.1 %   MCV 85.1 80.0 - 100.0 fL   MCH 26.9 26.0 - 34.0 pg   MCHC 31.6 30.0 - 36.0 g/dL   RDW 67.4 25.5 - 25.8 %   Platelets 254 150 - 400 K/uL   nRBC 0.0 0.0 - 0.2 %   Group B Strep positive, Rh negative  Assessment   G2P0010 at [redacted]w[redacted]d  Estimated Date of Delivery: 11/04/19  The fetus is reassuring.   Patient Active Problem List   Diagnosis Date Noted  . Labor and delivery, indication for care 10/18/2019  . Indication for care in labor or delivery 10/14/2019  . Indication for care in labor and delivery, antepartum 10/14/2019  . Preterm contractions 10/06/2019    Plan  1. Admit to L&D :   2. EFM:-- Category 1 3. Stadol or Epidural if desired.   4. Admission labs  5. Anticipate NSVD  Doreene Burke, CNM  10/18/2019 8:44 PM

## 2019-10-19 ENCOUNTER — Encounter: Payer: Self-pay | Admitting: Certified Nurse Midwife

## 2019-10-19 ENCOUNTER — Other Ambulatory Visit: Payer: Self-pay

## 2019-10-19 DIAGNOSIS — O99824 Streptococcus B carrier state complicating childbirth: Secondary | ICD-10-CM

## 2019-10-19 DIAGNOSIS — Z3A37 37 weeks gestation of pregnancy: Secondary | ICD-10-CM

## 2019-10-19 LAB — RPR: RPR Ser Ql: NONREACTIVE

## 2019-10-19 LAB — ABO/RH: ABO/RH(D): O NEG

## 2019-10-19 MED ORDER — SIMETHICONE 80 MG PO CHEW: 80.0000 mg | CHEWABLE_TABLET | ORAL | Status: DC | PRN

## 2019-10-19 MED ORDER — METHYLERGONOVINE MALEATE 0.2 MG PO TABS: 0.2000 mg | ORAL_TABLET | ORAL | Status: DC | PRN

## 2019-10-19 MED ORDER — ONDANSETRON HCL 4 MG/2ML IJ SOLN: 4.0000 mg | INTRAMUSCULAR | Status: DC | PRN

## 2019-10-19 MED ORDER — OXYTOCIN 40 UNITS IN NORMAL SALINE INFUSION - SIMPLE MED: 2.5000 [IU]/h | INTRAVENOUS | Status: DC

## 2019-10-19 MED ORDER — PRENATAL MULTIVITAMIN CH
1.0000 | ORAL_TABLET | Freq: Every day | ORAL | Status: DC
  Administered 2019-10-19 – 2019-10-20 (×2): 1 via ORAL
  Filled 2019-10-19 (×2): qty 1

## 2019-10-19 MED ORDER — COCONUT OIL OIL
1.0000 "application " | TOPICAL_OIL | Status: DC | PRN
  Administered 2019-10-19: 1 via TOPICAL
  Filled 2019-10-19: qty 120

## 2019-10-19 MED ORDER — OXYCODONE-ACETAMINOPHEN 5-325 MG PO TABS
1.0000 | ORAL_TABLET | ORAL | Status: DC | PRN
  Administered 2019-10-19 (×2): 1 via ORAL
  Filled 2019-10-19 (×2): qty 1

## 2019-10-19 MED ORDER — DIBUCAINE (PERIANAL) 1 % EX OINT: 1.0000 "application " | TOPICAL_OINTMENT | CUTANEOUS | Status: DC | PRN

## 2019-10-19 MED ORDER — ONDANSETRON HCL 4 MG PO TABS: 4.0000 mg | ORAL_TABLET | ORAL | Status: DC | PRN

## 2019-10-19 MED ORDER — DOCUSATE SODIUM 100 MG PO CAPS
100.0000 mg | ORAL_CAPSULE | Freq: Two times a day (BID) | ORAL | Status: DC
  Administered 2019-10-19 – 2019-10-20 (×2): 100 mg via ORAL
  Filled 2019-10-19 (×2): qty 1

## 2019-10-19 MED ORDER — FERROUS SULFATE 325 (65 FE) MG PO TABS
325.0000 mg | ORAL_TABLET | Freq: Every day | ORAL | Status: DC
  Administered 2019-10-19 – 2019-10-20 (×2): 325 mg via ORAL
  Filled 2019-10-19 (×2): qty 1

## 2019-10-19 MED ORDER — OXYCODONE-ACETAMINOPHEN 5-325 MG PO TABS: 2.0000 | ORAL_TABLET | ORAL | Status: DC | PRN

## 2019-10-19 MED ORDER — BENZOCAINE-MENTHOL 20-0.5 % EX AERO
1.0000 "application " | INHALATION_SPRAY | CUTANEOUS | Status: DC | PRN
  Administered 2019-10-19: 1 via TOPICAL
  Filled 2019-10-19: qty 56

## 2019-10-19 MED ORDER — METHYLERGONOVINE MALEATE 0.2 MG/ML IJ SOLN: 0.2000 mg | INTRAMUSCULAR | Status: DC | PRN

## 2019-10-19 MED ORDER — ACETAMINOPHEN 325 MG PO TABS
650.0000 mg | ORAL_TABLET | ORAL | Status: DC | PRN
  Administered 2019-10-19 – 2019-10-20 (×3): 650 mg via ORAL
  Filled 2019-10-19 (×3): qty 2

## 2019-10-19 MED ORDER — WITCH HAZEL-GLYCERIN EX PADS: 1.0000 "application " | MEDICATED_PAD | CUTANEOUS | Status: DC | PRN

## 2019-10-19 MED ORDER — SENNOSIDES-DOCUSATE SODIUM 8.6-50 MG PO TABS
2.0000 | ORAL_TABLET | ORAL | Status: DC
  Administered 2019-10-19: 2 via ORAL
  Filled 2019-10-19: qty 2

## 2019-10-19 NOTE — Lactation Note (Signed)
This note was copied from a baby's chart. Lactation Consultation Note  Patient Name: Holly Montgomery [id number] Date: 10/19/2019 Reason for consult: Follow-up assessment  LC student returned to patient's room to discuss colostrum storage, and methods to feed colostrum to baby (Mom had pumped 45ml of colostrum). LC student suggested cup and curved tip syringe as options, but Mom preferred the bottle nipple, and LC student collected two Extra Slow Flow (purple nipples). [facility] student explained paced bottle feeding to Mom, and demonstrated the positioning. Mom attempted to feed baby with the bottle, but baby was not sucking. [facility] student suggested that an attempt could be made using a curved tip syringe to finger feed baby, and that attempt was made, but baby never did suck or swallow any colostrum. One further attempt was made with the bottle, but baby was not interested. [facility] student explained that the colostrum was safe at room temperature for 3 to 4 hours, and that Mom could attempt to feed it to baby at a later stage.   Feeding Feeding Type: Bottle Fed - Breast Milk Nipple Type: Extra Slow Flow   Consult Status Consult Status: Follow-up Date: 10/19/19 Follow-up type: In-patient    [name] [name] 10/19/2019, 4:01 PM

## 2019-10-19 NOTE — Progress Notes (Signed)
LABOR NOTE   Holly Montgomery 18 y.o.@ at [redacted]w[redacted]d  SUBJECTIVE:  Comfortable with epidural.  Analgesia: Epidural  OBJECTIVE:  BP 113/81   Pulse (!) 111   Temp 98.2 F (36.8 C) (Oral)   Resp 16   LMP 01/28/2019   SpO2 98%  No intake/output data recorded.  She has shown cervical change. CERVIX: 8-9cm:  100%:   0:   mid position:   floppy SVE:   Dilation: 8.5 Effacement (%): 100 Station: 0 Exam by:: AThompson CONTRACTIONS: regular, every 2-3 minutes FHR: Fetal heart tracing reviewed. Baseline: 130 bpm, Variability: Good {> 6 bpm), Accelerations: Reactive and Decelerations: Absent Category I   Labs: Lab Results  Component Value Date   WBC 12.8 (H) 10/18/2019   HGB 11.2 (L) 10/18/2019   HCT 35.4 (L) 10/18/2019   MCV 85.1 10/18/2019   PLT 254 10/18/2019    ASSESSMENT: 1) Labor curve reviewed.       Progress: Active phase labor.     Membranes:AROM- ruptured, clear fluid,         Active Problems:   Labor and delivery, indication for care   PLAN: continue present management   Doreene Burke, CNM  10/19/2019 1:24 AM

## 2019-10-19 NOTE — Lactation Note (Signed)
This note was copied from a baby's chart. Lactation Consultation Note  Patient Name: Holly Montgomery [id number] Date: 10/19/2019 Reason for consult: Initial assessment;1st time breastfeeding;Early term 37-38.6wks;[place]  [place] student entered room at 2:26 for initial consult when baby was 55 hours old. [facility] student had attempted to see patient at 11:01, 11:15, and 1:28, but patient was either busy or sleeping.   When [facility] student entered room baby was latched on to the left breast in a modified cradle hold. Parents reported that baby [name] had been feeding since 1:50 with one 5 minute break. [facility] student enquired about breastfeeding experience and breast changed during pregnancy. Mom responded that her breasts grew "a lot". [facility] student observed baby feeding and listened for sucks which were not heard. [facility] student recommended that mom practice breast compressions while baby was at the breast, and Bear was able to achieve a suck to swallow ratio of 3:1 when Mom was doing compressions.   [place] student explained 37 week development, and the need for extra breast stimulation to protect milk supply. Mom was receptive, and [facility] student suggested that she could hand express or use the pump. Mom said that she would like to use the pump as she has one at home. [place] student left to confirm pumping with LC, and returned with a breast pump kit and cleaning supplies.   [facility] student took parents though pump set-up, use, and cleaning. Mom appears to be a good fit for a size 24 flange. Once pumping was started Mom immediately expressed drops of colostrum. [place] student encouraged parents to put baby Bear to the breast 8 to 12 times a day when he is showing feeding cues and to pump 4 to 6 times a day for additional stimulation and breast emptying.    Maternal Data Formula Feeding for Exclusion: No Has patient been taught Hand Expression?: No Does the patient have breastfeeding experience prior to this delivery?: No  Feeding Feeding  Type: Breast Fed  LATCH Score Latch: Grasps breast easily, tongue down, lips flanged, rhythmical sucking.  Audible Swallowing: A few with stimulation  Type of Nipple: Everted at rest and after stimulation  Comfort (Breast/Nipple): Soft / non-tender  Hold (Positioning): No assistance needed to correctly position infant at breast.  LATCH Score: 9  Interventions Interventions: Breast feeding basics reviewed;Breast compression;Coconut oil;DEBP  Lactation Tools Discussed/Used Tools: Pump;Coconut oil Breast pump type: Double-Electric Breast Pump Pump Review: Setup, frequency, and cleaning Initiated by:: Chrissie Noa Date initiated:: 10/19/19   Consult Status Consult Status: Follow-up Date: 10/19/19 Follow-up type: In-patient    Susette Racer Halynn Reitano 10/19/2019, 3:07 PM

## 2019-10-20 ENCOUNTER — Encounter: Payer: Self-pay | Admitting: Certified Nurse Midwife

## 2019-10-20 LAB — FETAL SCREEN: Fetal Screen: NEGATIVE

## 2019-10-20 MED ORDER — RHO D IMMUNE GLOBULIN 1500 UNIT/2ML IJ SOSY
300.0000 ug | PREFILLED_SYRINGE | Freq: Once | INTRAMUSCULAR | Status: AC
  Administered 2019-10-20: 300 ug via INTRAVENOUS
  Filled 2019-10-20: qty 2

## 2019-10-20 MED ORDER — MEDROXYPROGESTERONE ACETATE 150 MG/ML IM SUSP: 150.0000 mg | Freq: Once | INTRAMUSCULAR | 2 refills | Status: DC

## 2019-10-20 MED ORDER — FERROUS SULFATE 325 (65 FE) MG PO TABS: 325.0000 mg | ORAL_TABLET | Freq: Every day | ORAL | 3 refills | Status: AC

## 2019-10-20 MED ORDER — DOCUSATE SODIUM 100 MG PO CAPS: 100.0000 mg | ORAL_CAPSULE | Freq: Two times a day (BID) | ORAL | 0 refills | Status: DC

## 2019-10-20 MED ORDER — MEDROXYPROGESTERONE ACETATE 150 MG/ML IM SUSP
150.0000 mg | Freq: Once | INTRAMUSCULAR | Status: AC
  Administered 2019-10-20: 150 mg via INTRAMUSCULAR
  Filled 2019-10-20: qty 1

## 2019-10-20 MED ORDER — ACETAMINOPHEN 325 MG PO TABS: 650.0000 mg | ORAL_TABLET | ORAL | 0 refills | Status: AC | PRN

## 2019-10-20 NOTE — Progress Notes (Signed)
Pt discharged with infant.  Discharge instructions, prescriptions and follow up appointment given to and reviewed with pt. Pt verbalized understanding. Escorted out by auxillary. 

## 2019-10-20 NOTE — Lactation Note (Signed)
This note was copied from a baby's chart. Lactation Consultation Note  Patient Name: Boy Laquana Villari [id number] Date: 10/20/2019 Reason for consult: Follow-up assessment;1st time breastfeeding;Early term 37-38.6wks;Infant < 6lbs  LC in to check in on mom and Bear before discharge. Mom reports overnight feedings/pumping continued to go well. Mom is now able to hear audible swallows, and reports no pain/discomfort associated with feedings. Mom does have tender and more full breasts this morning. LC discussed normal course of lactation, colostrum to mature milk, breast and nipple care, adequate emptying of breast, hand expression/pumping for comfort post feeds or between feedings. Provided breastfeeding guidance for upcoming days: wet/stool diapers, color of stools, feeding frequency, growth spurts/cluster feedings, and how to obtain lactation support post discharge if needed. Encouraged mom to call out with questions or for assistance today if needed before discharge. Mom feels confident in breastfeeding.  Maternal Data Formula Feeding for Exclusion: No Has patient been taught Hand Expression?: Yes Does the patient have breastfeeding experience prior to this delivery?: No  Feeding    LATCH Score                   Interventions Interventions: Breast feeding basics reviewed;DEBP  Lactation Tools Discussed/Used Pump Review: Setup, frequency, and cleaning;Milk Storage Initiated by:: Merita Norton, LC student Date initiated:: 10/19/19   Consult Status Consult Status: Complete Date: 10/20/19 Follow-up type: Call as needed    Danford Bad 10/20/2019, 9:23 AM

## 2019-10-20 NOTE — Discharge Instructions (Signed)
Breastfeeding  Choosing to breastfeed is one of the best decisions you can make for yourself and your baby. A change in hormones during pregnancy causes your breasts to make breast milk in your milk-producing glands. Hormones prevent breast milk from being released before your baby is born. They also prompt milk flow after birth. Once breastfeeding has begun, thoughts of your baby, as well as his or her sucking or crying, can stimulate the release of milk from your milk-producing glands. Benefits of breastfeeding Research shows that breastfeeding offers many health benefits for infants and mothers. It also offers a cost-free and convenient way to feed your baby. For your baby  Your first milk (colostrum) helps your baby's digestive system to function better.  Special cells in your milk (antibodies) help your baby to fight off infections.  Breastfed babies are less likely to develop asthma, allergies, obesity, or type 2 diabetes. They are also at lower risk for sudden infant death syndrome (SIDS).  Nutrients in breast milk are better able to meet your baby's needs compared to infant formula.  Breast milk improves your baby's brain development. For you  Breastfeeding helps to create a very special bond between you and your baby.  Breastfeeding is convenient. Breast milk costs nothing and is always available at the correct temperature.  Breastfeeding helps to burn calories. It helps you to lose the weight that you gained during pregnancy.  Breastfeeding makes your uterus return faster to its size before pregnancy. It also slows bleeding (lochia) after you give birth.  Breastfeeding helps to lower your risk of developing type 2 diabetes, osteoporosis, rheumatoid arthritis, cardiovascular disease, and breast, ovarian, uterine, and endometrial cancer later in life. Breastfeeding basics Starting breastfeeding  Find a comfortable place to sit or lie down, with your neck and back  well-supported.  Place a pillow or a rolled-up blanket under your baby to bring him or her to the level of your breast (if you are seated). Nursing pillows are specially designed to help support your arms and your baby while you breastfeed.  Make sure that your baby's tummy (abdomen) is facing your abdomen.  Gently massage your breast. With your fingertips, massage from the outer edges of your breast inward toward the nipple. This encourages milk flow. If your milk flows slowly, you may need to continue this action during the feeding.  Support your breast with 4 fingers underneath and your thumb above your nipple (make the letter "C" with your hand). Make sure your fingers are well away from your nipple and your baby's mouth.  Stroke your baby's lips gently with your finger or nipple.  When your baby's mouth is open wide enough, quickly bring your baby to your breast, placing your entire nipple and as much of the areola as possible into your baby's mouth. The areola is the colored area around your nipple. ? More areola should be visible above your baby's upper lip than below the lower lip. ? Your baby's lips should be opened and extended outward (flanged) to ensure an adequate, comfortable latch. ? Your baby's tongue should be between his or her lower gum and your breast.  Make sure that your baby's mouth is correctly positioned around your nipple (latched). Your baby's lips should create a seal on your breast and be turned out (everted).  It is common for your baby to suck about 2-3 minutes in order to start the flow of breast milk. Latching Teaching your baby how to latch onto your  breast properly is very important. An improper latch can cause nipple pain, decreased milk supply, and poor weight gain in your baby. Also, if your baby is not latched onto your nipple properly, he or she may swallow some air during feeding. This can make your baby fussy. Burping your baby when you switch breasts  during the feeding can help to get rid of the air. However, teaching your baby to latch on properly is still the best way to prevent fussiness from swallowing air while breastfeeding. Signs that your baby has successfully latched onto your nipple  Silent tugging or silent sucking, without causing you pain. Infant's lips should be extended outward (flanged).  Swallowing heard between every 3-4 sucks once your milk has started to flow (after your let-down milk reflex occurs).  Muscle movement above and in front of his or her ears while sucking. Signs that your baby has not successfully latched onto your nipple  Sucking sounds or smacking sounds from your baby while breastfeeding.  Nipple pain. If you think your baby has not latched on correctly, slip your finger into the corner of your baby's mouth to break the suction and place it between your baby's gums. Attempt to start breastfeeding again. Signs of successful breastfeeding Signs from your baby  Your baby will gradually decrease the number of sucks or will completely stop sucking.  Your baby will fall asleep.  Your baby's body will relax.  Your baby will retain a small amount of milk in his or her mouth.  Your baby will let go of your breast by himself or herself. Signs from you  Breasts that have increased in firmness, weight, and size 1-3 hours after feeding.  Breasts that are softer immediately after breastfeeding.  Increased milk volume, as well as a change in milk consistency and color by the fifth day of breastfeeding.  Nipples that are not sore, cracked, or bleeding. Signs that your baby is getting enough milk  Wetting at least 1-2 diapers during the first 24 hours after birth.  Wetting at least 5-6 diapers every 24 hours for the first week after birth. The urine should be clear or pale yellow by the age of 5 days.  Wetting 6-8 diapers every 24 hours as your baby continues to grow and develop.  At least 3 stools in  a 24-hour period by the age of 5 days. The stool should be soft and yellow.  At least 3 stools in a 24-hour period by the age of 7 days. The stool should be seedy and yellow.  No loss of weight greater than 10% of birth weight during the first 3 days of life.  Average weight gain of 4-7 oz (113-198 g) per week after the age of 4 days.  Consistent daily weight gain by the age of 5 days, without weight loss after the age of 2 weeks. After a feeding, your baby may spit up a small amount of milk. This is normal. Breastfeeding frequency and duration Frequent feeding will help you make more milk and can prevent sore nipples and extremely full breasts (breast engorgement). Breastfeed when you feel the need to reduce the fullness of your breasts or when your baby shows signs of hunger. This is called "breastfeeding on demand." Signs that your baby is hungry include:  Increased alertness, activity, or restlessness.  Movement of the head from side to side.  Opening of the mouth when the corner of the mouth or cheek is stroked (rooting).  Increased sucking sounds,  smacking lips, cooing, sighing, or squeaking.  Hand-to-mouth movements and sucking on fingers or hands.  Fussing or crying. Avoid introducing a pacifier to your baby in the first 4-6 weeks after your baby is born. After this time, you may choose to use a pacifier. Research has shown that pacifier use during the first year of a baby's life decreases the risk of sudden infant death syndrome (SIDS). Allow your baby to feed on each breast as long as he or she wants. When your baby unlatches or falls asleep while feeding from the first breast, offer the second breast. Because newborns are often sleepy in the first few weeks of life, you may need to awaken your baby to get him or her to feed. Breastfeeding times will vary from baby to baby. However, the following rules can serve as a guide to help you make sure that your baby is properly  fed:  Newborns (babies 32 weeks of age or younger) may breastfeed every 1-3 hours.  Newborns should not go without breastfeeding for longer than 3 hours during the day or 5 hours during the night.  You should breastfeed your baby a minimum of 8 times in a 24-hour period. Breast milk pumping     Pumping and storing breast milk allows you to make sure that your baby is exclusively fed your breast milk, even at times when you are unable to breastfeed. This is especially important if you go back to work while you are still breastfeeding, or if you are not able to be present during feedings. Your lactation consultant can help you find a method of pumping that works best for you and give you guidelines about how long it is safe to store breast milk. Caring for your breasts while you breastfeed Nipples can become dry, cracked, and sore while breastfeeding. The following recommendations can help keep your breasts moisturized and healthy:  Avoid using soap on your nipples.  Wear a supportive bra designed especially for nursing. Avoid wearing underwire-style bras or extremely tight bras (sports bras).  Air-dry your nipples for 3-4 minutes after each feeding.  Use only cotton bra pads to absorb leaked breast milk. Leaking of breast milk between feedings is normal.  Use lanolin on your nipples after breastfeeding. Lanolin helps to maintain your skin's normal moisture barrier. Pure lanolin is not harmful (not toxic) to your baby. You may also hand express a few drops of breast milk and gently massage that milk into your nipples and allow the milk to air-dry. In the first few weeks after giving birth, some women experience breast engorgement. Engorgement can make your breasts feel heavy, warm, and tender to the touch. Engorgement peaks within 3-5 days after you give birth. The following recommendations can help to ease engorgement:  Completely empty your breasts while breastfeeding or pumping. You may  want to start by applying warm, moist heat (in the shower or with warm, water-soaked hand towels) just before feeding or pumping. This increases circulation and helps the milk flow. If your baby does not completely empty your breasts while breastfeeding, pump any extra milk after he or she is finished.  Apply ice packs to your breasts immediately after breastfeeding or pumping, unless this is too uncomfortable for you. To do this: ? Put ice in a plastic bag. ? Place a towel between your skin and the bag. ? Leave the ice on for 20 minutes, 2-3 times a day.  Make sure that your baby is latched on and positioned properly  while breastfeeding. If engorgement persists after 48 hours of following these recommendations, contact your health care provider or a Advertising copywriter. Overall health care recommendations while breastfeeding  Eat 3 healthy meals and 3 snacks every day. Well-nourished mothers who are breastfeeding need an additional 450-500 calories a day. You can meet this requirement by increasing the amount of a balanced diet that you eat.  Drink enough water to keep your urine pale yellow or clear.  Rest often, relax, and continue to take your prenatal vitamins to prevent fatigue, stress, and low vitamin and mineral levels in your body (nutrient deficiencies).  Do not use any products that contain nicotine or tobacco, such as cigarettes and e-cigarettes. Your baby may be harmed by chemicals from cigarettes that pass into breast milk and exposure to secondhand smoke. If you need help quitting, ask your health care provider.  Avoid alcohol.  Do not use illegal drugs or marijuana.  Talk with your health care provider before taking any medicines. These include over-the-counter and prescription medicines as well as vitamins and herbal supplements. Some medicines that may be harmful to your baby can pass through breast milk.  It is possible to become pregnant while breastfeeding. If birth  control is desired, ask your health care provider about options that will be safe while breastfeeding your baby. Where to find more information: [employer]: www.llli.org Contact a health care provider if:  You feel like you want to stop breastfeeding or have become frustrated with breastfeeding.  Your nipples are cracked or bleeding.  Your breasts are red, tender, or warm.  You have: ? Painful breasts or nipples. ? A swollen area on either breast. ? A fever or chills. ? Nausea or vomiting. ? Drainage other than breast milk from your nipples.  Your breasts do not become full before feedings by the fifth day after you give birth.  You feel sad and depressed.  Your baby is: ? Too sleepy to eat well. ? Having trouble sleeping. ? More than 26 week old and wetting fewer than 6 diapers in a 24-hour period. ? Not gaining weight by 55 days of age.  Your baby has fewer than 3 stools in a 24-hour period.  Your baby's skin or the white parts of his or her eyes become yellow. Get help right away if:  Your baby is overly tired (lethargic) and does not want to wake up and feed.  Your baby develops an unexplained fever. Summary  Breastfeeding offers many health benefits for infant and mothers.  Try to breastfeed your infant when he or she shows early signs of hunger.  Gently tickle or stroke your baby's lips with your finger or nipple to allow the baby to open his or her mouth. Bring the baby to your breast. Make sure that much of the areola is in your baby's mouth. Offer one side and burp the baby before you offer the other side.  Talk with your health care provider or lactation consultant if you have questions or you face problems as you breastfeed. This information is not intended to replace advice given to you by your health care provider. Make sure you discuss any questions you have with your health care provider. Document Revised: 10/15/2017 Document Reviewed:  08/22/2016 Elsevier Patient Education  2020 [employer].   Breastfeeding Tips for a Good Latch Latching is how your baby's mouth attaches to your nipple to breastfeed. It is an important part of breastfeeding. Your baby may have trouble latching  for a number of reasons. A poor latch may cause you to have cracked or sore nipples or other problems. Follow these instructions at home: How to position your baby  Find a comfortable place to sit or lie down. Your neck and back should be well supported.  If you are seated, place a pillow or rolled-up blanket under your baby. This will bring him or her to the level of your breast.  Make sure that your baby's belly (abdomen) is facing your belly.  Try different positions to find one that works best for you and your baby. How to help your baby latch   To start, gently rub your breast. Move your fingertips in a circle as you massage from your chest wall toward your nipple. This helps milk flow. Keep doing this during feeding if needed.  Position your breast. Hold your breast with four fingers underneath and your thumb above your nipple. Keep your fingers away from your nipple and your baby's mouth. Follow these steps to help your baby latch: 1. Rub your baby's lips gently with your finger or nipple. 2. When your baby's mouth is open wide enough, quickly bring your baby to your breast and place your whole nipple into your baby's mouth. Place as much of the colored area around your nipple (areola)as possible into your baby's mouth. 3. Your baby's tongue should be between his or her lower gum and your breast. 4. You should be able to see more areola above your baby's upper lip than below the lower lip. 5. When your baby starts sucking, you will feel a gentle pull on your nipple. You should not feel any pain. Be patient. It is common for a baby to suck for about 2-3 minutes to start the flow of breast milk. 6. Make sure that your baby's mouth is in the  right position around your nipple. Your baby's lips should make a seal on your breast and be turned outward.  General instructions  Look for these signs that your baby has latched on to your nipple: ? The baby is quietly tugging or sucking without causing you pain. ? You hear the baby swallow after every 3 or 4 sucks. ? You see movement above and in front of the baby's ears while he or she is sucking.  Be aware of these signs that your baby has not latched on to your nipple: ? The baby makes sucking sounds or smacking sounds while feeding. ? You have nipple pain.  If your baby is not latched well, put your little finger between your baby's gums and your nipple. This will break the seal. Then try to help your baby latch again.  If you keep having problems, get help from a breastfeeding specialist (Advertising copywriter). Contact a doctor if:  You have cracking or soreness in your nipples that lasts longer than 1 week.  You have nipple pain.  Your breasts are filled with too much milk (engorgement), and this does not improve after 48-72 hours.  You have a plugged milk duct and a fever.  You follow the tips for a good latch but you keep having problems or concerns.  You have a pus-like fluid coming from your breast.  Your baby is not gaining weight.  Your baby loses weight. Summary  Latching is how your baby's mouth attaches to your nipple to breastfeed.  Try different positions for breastfeeding to find one that works best for you and your baby.  A poor latch may cause  you to have cracked or sore nipples or other problems. This information is not intended to replace advice given to you by your health care provider. Make sure you discuss any questions you have with your health care provider. Document Revised: 11/10/2018 Document Reviewed: 02/25/2017 Elsevier Patient Education  2020 [employer]  Hormonal Contraception Information Hormonal contraception is a type of birth  control that uses hormones to prevent pregnancy. It usually involves a combination of the hormones estrogen and progesterone or only the hormone progesterone. Hormonal contraception works in these ways:  It thickens the mucus in the cervix, making it harder for sperm to enter the uterus.  It changes the lining of the uterus, making it harder for an egg to implant.  It may stop the ovaries from releasing eggs (ovulation). Some women who take hormonal contraceptives that contain only progesterone may continue to ovulate. Hormonal contraception cannot prevent sexually transmitted infections (STIs). Pregnancy may still occur. Estrogen and progesterone contraceptives Contraceptives that use a combination of estrogen and progesterone are available in these forms:  Pill. Pills come in different combinations of hormones. They must be taken at the same time each day. Pills can affect your period, causing you to get your period once every three months or not at all.  Patch. The patch must be worn on the lower abdomen for three weeks and then removed on the fourth.  Vaginal ring. The ring is placed in the vagina and left there for three weeks. It is then removed for one week. Progesterone contraceptives Contraceptives that use progesterone only are available in these forms:  Pill. Pills should be taken every day of the cycle.  Intrauterine device (IUD). This device is inserted into the uterus and removed or replaced every five years or sooner.  Implant. Plastic rods are placed under the skin of the upper arm. They are removed or replaced every three years or sooner.  Injection. The injection is given once every 90 days. What are the side effects? The side effects of estrogen and progesterone contraceptives include:  Nausea.  Headaches.  Breast tenderness.  Bleeding or spotting between menstrual cycles.  High blood pressure (rare).  Strokes, heart attacks, or blood clots (rare) Side  effects of progesterone-only contraceptives include:  Nausea.  Headaches.  Breast tenderness.  Unpredictable menstrual bleeding.  High blood pressure (rare). Talk to your health care provider about what side effects may affect you. Where to find more information  Ask your health care provider for more information and resources about hormonal contraception.  [place] Department of Health and [employer] Office on Women's Health: http://hoffman.com/www.womenshealth.gov Questions to ask:  What type of hormonal contraception is right for me?  How long should I plan to use hormonal contraception?  What are the side effects of the hormonal contraception method I choose?  How can I prevent STIs while using hormonal contraception? Contact a health care provider if:  You start taking hormonal contraceptives and you develop persistent or severe side effects. Summary  Estrogen and progesterone are hormones used in many forms of birth control.  Talk to your health care provider about what side effects may affect you.  Hormonal contraception cannot prevent sexually transmitted infections (STIs).  Ask your health care provider for more information and resources about hormonal contraception. This information is not intended to replace advice given to you by your health care provider. Make sure you discuss any questions you have with your health care provider. Document Revised: 11/15/2018 Document Reviewed: 06/20/2016 Elsevier Patient  Education  2020 [employer]  Postpartum Care After Vaginal Delivery This sheet gives you information about how to care for yourself from the time you deliver your baby to up to 6-12 weeks after delivery (postpartum period). Your health care provider may also give you more specific instructions. If you have problems or questions, contact your health care provider. Follow these instructions at home: Vaginal bleeding  It is normal to have vaginal bleeding (lochia) after  delivery. Wear a sanitary pad for vaginal bleeding and discharge. ? During the first week after delivery, the amount and appearance of lochia is often similar to a menstrual period. ? Over the next few weeks, it will gradually decrease to a dry, yellow-brown discharge. ? For most women, lochia stops completely by 4-6 weeks after delivery. Vaginal bleeding can vary from woman to woman.  Change your sanitary pads frequently. Watch for any changes in your flow, such as: ? A sudden increase in volume. ? A change in color. ? Large blood clots.  If you pass a blood clot from your vagina, save it and call your health care provider to discuss. Do not flush blood clots down the toilet before talking with your health care provider.  Do not use tampons or douches until your health care provider says this is safe.  If you are not breastfeeding, your period should return 6-8 weeks after delivery. If you are feeding your child breast milk only (exclusive breastfeeding), your period may not return until you stop breastfeeding. Perineal care  Keep the area between the vagina and the anus (perineum) clean and dry as told by your health care provider. Use medicated pads and pain-relieving sprays and creams as directed.  If you had a cut in the perineum (episiotomy) or a tear in the vagina, check the area for signs of infection until you are healed. Check for: ? More redness, swelling, or pain. ? Fluid or blood coming from the cut or tear. ? Warmth. ? Pus or a bad smell.  You may be given a squirt bottle to use instead of wiping to clean the perineum area after you go to the bathroom. As you start healing, you may use the squirt bottle before wiping yourself. Make sure to wipe gently.  To relieve pain caused by an episiotomy, a tear in the vagina, or swollen veins in the anus (hemorrhoids), try taking a warm sitz bath 2-3 times a day. A sitz bath is a warm water bath that is taken while you are sitting down.  The water should only come up to your hips and should cover your buttocks. Breast care  Within the first few days after delivery, your breasts may feel heavy, full, and uncomfortable (breast engorgement). Milk may also leak from your breasts. Your health care provider can suggest ways to help relieve the discomfort. Breast engorgement should go away within a few days.  If you are breastfeeding: ? Wear a bra that supports your breasts and fits you well. ? Keep your nipples clean and dry. Apply creams and ointments as told by your health care provider. ? You may need to use breast pads to absorb milk that leaks from your breasts. ? You may have uterine contractions every time you breastfeed for up to several weeks after delivery. Uterine contractions help your uterus return to its normal size. ? If you have any problems with breastfeeding, work with your health care provider or Advertising copywriter.  If you are not breastfeeding: ? Avoid touching your  breasts a lot. Doing this can make your breasts produce more milk. ? Wear a good-fitting bra and use cold packs to help with swelling. ? Do not squeeze out (express) milk. This causes you to make more milk. Intimacy and sexuality  Ask your health care provider when you can engage in sexual activity. This may depend on: ? Your risk of infection. ? How fast you are healing. ? Your comfort and desire to engage in sexual activity.  You are able to get pregnant after delivery, even if you have not had your period. If desired, talk with your health care provider about methods of birth control (contraception). Medicines  Take over-the-counter and prescription medicines only as told by your health care provider.  If you were prescribed an antibiotic medicine, take it as told by your health care provider. Do not stop taking the antibiotic even if you start to feel better. Activity  Gradually return to your normal activities as told by your health  care provider. Ask your health care provider what activities are safe for you.  Rest as much as possible. Try to rest or take a nap while your baby is sleeping. Eating and drinking   Drink enough fluid to keep your urine pale yellow.  Eat high-fiber foods every day. These may help prevent or relieve constipation. High-fiber foods include: ? Whole grain cereals and breads. ? Brown rice. ? Beans. ? Fresh fruits and vegetables.  Do not try to lose weight quickly by cutting back on calories.  Take your prenatal vitamins until your postpartum checkup or until your health care provider tells you it is okay to stop. Lifestyle  Do not use any products that contain nicotine or tobacco, such as cigarettes and e-cigarettes. If you need help quitting, ask your health care provider.  Do not drink alcohol, especially if you are breastfeeding. General instructions  Keep all follow-up visits for you and your baby as told by your health care provider. Most women visit their health care provider for a postpartum checkup within the first 3-6 weeks after delivery. Contact a health care provider if:  You feel unable to cope with the changes that your child brings to your life, and these feelings do not go away.  You feel unusually sad or worried.  Your breasts become red, painful, or hard.  You have a fever.  You have trouble holding urine or keeping urine from leaking.  You have little or no interest in activities you used to enjoy.  You have not breastfed at all and you have not had a menstrual period for 12 weeks after delivery.  You have stopped breastfeeding and you have not had a menstrual period for 12 weeks after you stopped breastfeeding.  You have questions about caring for yourself or your baby.  You pass a blood clot from your vagina. Get help right away if:  You have chest pain.  You have difficulty breathing.  You have sudden, severe leg pain.  You have severe pain or  cramping in your lower abdomen.  You bleed from your vagina so much that you fill more than one sanitary pad in one hour. Bleeding should not be heavier than your heaviest period.  You develop a severe headache.  You faint.  You have blurred vision or spots in your vision.  You have bad-smelling vaginal discharge.  You have thoughts about hurting yourself or your baby. If you ever feel like you may hurt yourself or others, or have thoughts  about taking your own life, get help right away. You can go to the nearest emergency department or call:  Your local emergency services (911 in the [place]  A suicide crisis helpline, such as the National Suicide Prevention Lifeline at (272) 801-0939. This is open 24 hours a day. Summary  The period of time right after you deliver your newborn up to 6-12 weeks after delivery is called the postpartum period.  Gradually return to your normal activities as told by your health care provider.  Keep all follow-up visits for you and your baby as told by your health care provider. This information is not intended to replace advice given to you by your health care provider. Make sure you discuss any questions you have with your health care provider. Document Revised: 07/24/2017 Document Reviewed: 05/04/2017 Elsevier Patient Education  2020 [employer].  Postpartum Baby Blues The postpartum period begins right after the birth of a baby. During this time, there is often a lot of joy and excitement. It is also a time of many changes in the life of the parents. No matter how many times a mother gives birth, each child brings new challenges to the family, including different ways of relating to one another. It is common to have feelings of excitement along with confusing changes in moods, emotions, and thoughts. You may feel happy one minute and sad or stressed the next. These feelings of sadness usually happen in the period right after you have your baby, and  they go away within a week or two. This is called the "baby blues." What are the causes? There is no known cause of baby blues. It is likely caused by a combination of factors. However, changes in hormone levels after childbirth are believed to trigger some of the symptoms. Other factors that can play a role in these mood changes include:  Lack of sleep.  Stressful life events, such as poverty, caring for a loved one, or death of a loved one.  Genetics. What are the signs or symptoms? Symptoms of this condition include:  Brief changes in mood, such as going from extreme happiness to sadness.  Decreased concentration.  Difficulty sleeping.  Crying spells and tearfulness.  Loss of appetite.  Irritability.  Anxiety. If the symptoms of baby blues last for more than 2 weeks or become more severe, you may have postpartum depression. How is this diagnosed? This condition is diagnosed based on an evaluation of your symptoms. There are no medical or lab tests that lead to a diagnosis, but there are various questionnaires that a health care provider may use to identify women with the baby blues or postpartum depression. How is this treated? Treatment is not needed for this condition. The baby blues usually go away on their own in 1-2 weeks. Social support is often all that is needed. You will be encouraged to get adequate sleep and rest. Follow these instructions at home: Lifestyle      Get as much rest as you can. Take a nap when the baby sleeps.  Exercise regularly as told by your health care provider. Some women find yoga and walking to be helpful.  Eat a balanced and nourishing diet. This includes plenty of fruits and vegetables, whole grains, and lean proteins.  Do little things that you enjoy. Have a cup of tea, take a bubble bath, read your favorite magazine, or listen to your favorite music.  Avoid alcohol.  Ask for help with household chores, cooking, grocery shopping,  or running errands. Do not try to do everything yourself. Consider hiring a postpartum doula to help. This is a professional who specializes in providing support to new mothers.  Try not to make any major life changes during pregnancy or right after giving birth. This can add stress. General instructions  Talk to people close to you about how you are feeling. Get support from your partner, family members, friends, or other new moms. You may want to join a support group.  Find ways to cope with stress. This may include: ? Writing your thoughts and feelings in a journal. ? Spending time outside. ? Spending time with people who make you laugh.  Try to stay positive in how you think. Think about the things you are grateful for.  Take over-the-counter and prescription medicines only as told by your health care provider.  Let your health care provider know if you have any concerns.  Keep all postpartum visits as told by your health care provider. This is important. Contact a health care provider if:  Your baby blues do not go away after 2 weeks. Get help right away if:  You have thoughts of taking your own life (suicidal thoughts).  You think you may harm the baby or other people.  You see or hear things that are not there (hallucinations). Summary  After giving birth, you may feel happy one minute and sad or stressed the next. Feelings of sadness that happen right after the baby is born and go away after a week or two are called the "baby blues."  You can manage the baby blues by getting enough rest, eating a healthy diet, exercising, spending time with supportive people, and finding ways to cope with stress.  If feelings of sadness and stress last longer than 2 weeks or get in the way of caring for your baby, talk to your health care provider. This may mean you have postpartum depression. This information is not intended to replace advice given to you by your health care provider.  Make sure you discuss any questions you have with your health care provider. Document Revised: 11/12/2018 Document Reviewed: 09/16/2016 Elsevier Patient Education  2020 [employer].

## 2019-10-20 NOTE — Anesthesia Postprocedure Evaluation (Signed)
Anesthesia Post Note  Patient: Holly Montgomery  Procedure(s) Performed: AN AD HOC LABOR EPIDURAL  Patient location during evaluation: Mother Baby Anesthesia Type: Epidural Level of consciousness: awake and alert Pain management: pain level controlled Vital Signs Assessment: post-procedure vital signs reviewed and stable Respiratory status: spontaneous breathing, nonlabored ventilation and respiratory function stable Cardiovascular status: stable Postop Assessment: no headache, no backache and epidural receding Anesthetic complications: no     Last Vitals:  Vitals:   10/20/19 0022 10/20/19 0813  BP: 104/64 100/71  Pulse: 63 60  Resp: 18 18  Temp: 36.5 C 36.8 C  SpO2: 99% 100%    Last Pain:  Vitals:   10/20/19 0826  TempSrc:   PainSc: 6                  Bejamin Hackbart,  Alessandra Bevels

## 2019-10-20 NOTE — Discharge Summary (Signed)
Obstetric Discharge Summary  Patient ID: Holly Montgomery MRN: [id number] DOB/AGE: April 20, 2001 19 y.o.   Date of Admission: 10/18/2019  Date of Discharge: 10/20/19   Admitting Diagnosis: Onset of Labor at [redacted]w[redacted]d  Secondary Diagnosis: History of depression  Mode of Delivery: normal spontaneous vaginal birth     Discharge Diagnosis: No other diagnosis   Intrapartum Procedures: Atificial rupture of membranes, epidural and GBS prophylaxis   Post partum procedures: rhogam  Complications: None   Brief Hospital Course   Holly Montgomery is a [id number] who had a spontaneous vaginal birth on 10/19/2019;  for further details, please refer to the delivey summary.  Patient had an uncomplicated postpartum course.  By time of discharge on PPD#1, her pain was controlled on oral pain medications; she had appropriate lochia and was ambulating, voiding without difficulty and tolerating regular diet.  She was deemed stable for discharge to home.    Labs: CBC Latest Ref Rng & Units 10/18/2019 08/15/2019 05/03/2019  WBC 4.0 - 10.5 K/uL 12.8(H) 10.1 -  Hemoglobin 12.0 - 15.0 g/dL 11.2(L) 10.5(L) 12.6  Hematocrit 36.0 - 46.0 % 35.4(L) 31.9(L) 38  Platelets 150 - 400 K/uL 254 251 -   O NEG Performed at [facility], [address] [place], [place] [address]   Physical exam:   Temp:  [97.7 F (36.5 C)-98.9 F (37.2 C)] 98.2 F (36.8 C) (03/18 0813) Pulse Rate:  [60-89] 60 (03/18 0813) Resp:  [18] 18 (03/18 0813) BP: (100-105)/(60-71) 100/71 (03/18 0813) SpO2:  [98 %-100 %] 100 % (03/18 0813)  General: alert and no distress  Lochia: appropriate  Abdomen: soft, NT  Uterine Fundus: firm  Perineum: healing well, no significant drainage, no dehiscence, no significant erythema  Extremities: No evidence of DVT seen on physical exam. No lower extremity edema.  Edinburgh Postnatal Depression Scale Screening Tool 10/20/2019 10/19/2019 10/19/2019  I have been able to laugh and see the funny  side of things. 0 (No Data) (No Data)  I have looked forward with enjoyment to things. 0 - -  I have blamed myself unnecessarily when things went wrong. 0 - -  I have been anxious or worried for no good reason. 1 - -  I have felt scared or panicky for no good reason. 2 - -  Things have been getting on top of me. 0 - -  I have been so unhappy that I have had difficulty sleeping. 0 - -  I have felt sad or miserable. 0 - -  I have been so unhappy that I have been crying. 0 - -  The thought of harming myself has occurred to me. 0 - -  Edinburgh Postnatal Depression Scale Total 3 - -     Discharge Instructions: Per After Visit Summary.  Activity: Advance as tolerated. Pelvic rest for 6 weeks.  Also refer to After Visit Summary  Diet: Regular  Medications: Allergies as of 10/20/2019      Reactions   Motrin [ibuprofen] Hives   Latex Rash      Medication List    STOP taking these medications   Fusion Plus Caps   NIFEdipine 10 MG capsule Commonly known as: PROCARDIA   ondansetron 4 MG disintegrating tablet Commonly known as: Zofran ODT   zolpidem 5 MG tablet Commonly known as: AMBIEN     TAKE these medications   acetaminophen 325 MG tablet Commonly known as: Tylenol Take 2 tablets (650 mg total) by mouth every 4 (four) hours as needed (for pain  scale < 4).   docusate sodium 100 MG capsule Commonly known as: COLACE Take 1 capsule (100 mg total) by mouth 2 (two) times daily.   ferrous sulfate 325 (65 FE) MG tablet Take 1 tablet (325 mg total) by mouth daily with breakfast. Start taking on: October 21, 2019   medroxyPROGESTERone 150 MG/ML injection Commonly known as: DEPO-PROVERA Inject 1 mL (150 mg total) into the muscle once for 1 dose.   prenatal multivitamin Tabs tablet Take 1 tablet by mouth daily at 12 noon.   sertraline 50 MG tablet Commonly known as: ZOLOFT Take 50 mg by mouth daily.      Outpatient follow up:  Follow-up Information    Gunnar Bulla, CNM. Go to.   Specialties: Certified Nurse Midwife, Obstetrics and Gynecology, Radiology Why: As scheduled by office at two (2) and six (6) weeks Contact information: [address] [place] [address] [phone]          Postpartum contraception: Depo-Provera, first injection given prior to discharge  Discharged Condition: stable  Discharged to: home   Newborn Data:  Disposition:home with mother  Apgars: APGAR (1 MIN):  8 APGAR (5 MINS):  9  Baby Feeding: Breast   Gunnar Bulla, CNM [facility], [facility] 10/20/19 12:12 PM

## 2019-10-21 ENCOUNTER — Encounter: Payer: Medicaid Other | Admitting: Certified Nurse Midwife

## 2019-10-21 LAB — RHOGAM INJECTION: Unit division: 0

## 2019-11-03 ENCOUNTER — Other Ambulatory Visit: Payer: Self-pay

## 2019-11-03 ENCOUNTER — Ambulatory Visit (INDEPENDENT_AMBULATORY_CARE_PROVIDER_SITE_OTHER): Payer: Medicaid Other | Admitting: Certified Nurse Midwife

## 2019-11-03 VITALS — BP 98/63 | HR 86 | Wt 127.0 lb

## 2019-11-03 DIAGNOSIS — Z1331 Encounter for screening for depression: Secondary | ICD-10-CM | POA: Diagnosis not present

## 2019-11-03 NOTE — Patient Instructions (Signed)
Postpartum Baby Blues The postpartum period begins right after the birth of a baby. During this time, there is often a lot of joy and excitement. It is also a time of many changes in the life of the parents. No matter how many times a mother gives birth, each child brings new challenges to the family, including different ways of relating to one another. It is common to have feelings of excitement along with confusing changes in moods, emotions, and thoughts. You may feel happy one minute and sad or stressed the next. These feelings of sadness usually happen in the period right after you have your baby, and they go away within a week or two. This is called the "baby blues." What are the causes? There is no known cause of baby blues. It is likely caused by a combination of factors. However, changes in hormone levels after childbirth are believed to trigger some of the symptoms. Other factors that can play a role in these mood changes include:  Lack of sleep.  Stressful life events, such as poverty, caring for a loved one, or death of a loved one.  Genetics. What are the signs or symptoms? Symptoms of this condition include:  Brief changes in mood, such as going from extreme happiness to sadness.  Decreased concentration.  Difficulty sleeping.  Crying spells and tearfulness.  Loss of appetite.  Irritability.  Anxiety. If the symptoms of baby blues last for more than 2 weeks or become more severe, you may have postpartum depression. How is this diagnosed? This condition is diagnosed based on an evaluation of your symptoms. There are no medical or lab tests that lead to a diagnosis, but there are various questionnaires that a health care provider may use to identify women with the baby blues or postpartum depression. How is this treated? Treatment is not needed for this condition. The baby blues usually go away on their own in 1-2 weeks. Social support is often all that is needed. You  will be encouraged to get adequate sleep and rest. Follow these instructions at home: Lifestyle      Get as much rest as you can. Take a nap when the baby sleeps.  Exercise regularly as told by your health care provider. Some women find yoga and walking to be helpful.  Eat a balanced and nourishing diet. This includes plenty of fruits and vegetables, whole grains, and lean proteins.  Do little things that you enjoy. Have a cup of tea, take a bubble bath, read your favorite magazine, or listen to your favorite music.  Avoid alcohol.  Ask for help with household chores, cooking, grocery shopping, or running errands. Do not try to do everything yourself. Consider hiring a postpartum doula to help. This is a professional who specializes in providing support to new mothers.  Try not to make any major life changes during pregnancy or right after giving birth. This can add stress. General instructions  Talk to people close to you about how you are feeling. Get support from your partner, family members, friends, or other new moms. You may want to join a support group.  Find ways to cope with stress. This may include: ? Writing your thoughts and feelings in a journal. ? Spending time outside. ? Spending time with people who make you laugh.  Try to stay positive in how you think. Think about the things you are grateful for.  Take over-the-counter and prescription medicines only as told by your health care provider.  Let your health care provider know if you have any concerns.  Keep all postpartum visits as told by your health care provider. This is important. Contact a health care provider if:  Your baby blues do not go away after 2 weeks. Get help right away if:  You have thoughts of taking your own life (suicidal thoughts).  You think you may harm the baby or other people.  You see or hear things that are not there (hallucinations). Summary  After giving birth, you may feel  happy one minute and sad or stressed the next. Feelings of sadness that happen right after the baby is born and go away after a week or two are called the "baby blues."  You can manage the baby blues by getting enough rest, eating a healthy diet, exercising, spending time with supportive people, and finding ways to cope with stress.  If feelings of sadness and stress last longer than 2 weeks or get in the way of caring for your baby, talk to your health care provider. This may mean you have postpartum depression. This information is not intended to replace advice given to you by your health care provider. Make sure you discuss any questions you have with your health care provider. Document Revised: 11/12/2018 Document Reviewed: 09/16/2016 Elsevier Patient Education  [employer].   Breastfeeding Tips for a Good Latch Latching is how your baby's mouth attaches to your nipple to breastfeed. It is an important part of breastfeeding. Your baby may have trouble latching for a number of reasons. A poor latch may cause you to have cracked or sore nipples or other problems. Follow these instructions at home: How to position your baby  Find a comfortable place to sit or lie down. Your neck and back should be well supported.  If you are seated, place a pillow or rolled-up blanket under your baby. This will bring him or her to the level of your breast.  Make sure that your baby's belly (abdomen) is facing your belly.  Try different positions to find one that works best for you and your baby. How to help your baby latch   To start, gently rub your breast. Move your fingertips in a circle as you massage from your chest wall toward your nipple. This helps milk flow. Keep doing this during feeding if needed.  Position your breast. Hold your breast with four fingers underneath and your thumb above your nipple. Keep your fingers away from your nipple and your baby's mouth. Follow these steps to help  your baby latch: 1. Rub your baby's lips gently with your finger or nipple. 2. When your baby's mouth is open wide enough, quickly bring your baby to your breast and place your whole nipple into your baby's mouth. Place as much of the colored area around your nipple (areola)as possible into your baby's mouth. 3. Your baby's tongue should be between his or her lower gum and your breast. 4. You should be able to see more areola above your baby's upper lip than below the lower lip. 5. When your baby starts sucking, you will feel a gentle pull on your nipple. You should not feel any pain. Be patient. It is common for a baby to suck for about 2-3 minutes to start the flow of breast milk. 6. Make sure that your baby's mouth is in the right position around your nipple. Your baby's lips should make a seal on your breast and be turned outward.  General instructions  Look for these signs that your baby has latched on to your nipple: ? The baby is quietly tugging or sucking without causing you pain. ? You hear the baby swallow after every 3 or 4 sucks. ? You see movement above and in front of the baby's ears while he or she is sucking.  Be aware of these signs that your baby has not latched on to your nipple: ? The baby makes sucking sounds or smacking sounds while feeding. ? You have nipple pain.  If your baby is not latched well, put your little finger between your baby's gums and your nipple. This will break the seal. Then try to help your baby latch again.  If you keep having problems, get help from a breastfeeding specialist (Advertising copywriter). Contact a doctor if:  You have cracking or soreness in your nipples that lasts longer than 1 week.  You have nipple pain.  Your breasts are filled with too much milk (engorgement), and this does not improve after 48-72 hours.  You have a plugged milk duct and a fever.  You follow the tips for a good latch but you keep having problems or  concerns.  You have a pus-like fluid coming from your breast.  Your baby is not gaining weight.  Your baby loses weight. Summary  Latching is how your baby's mouth attaches to your nipple to breastfeed.  Try different positions for breastfeeding to find one that works best for you and your baby.  A poor latch may cause you to have cracked or sore nipples or other problems. This information is not intended to replace advice given to you by your health care provider. Make sure you discuss any questions you have with your health care provider. Document Revised: 11/10/2018 Document Reviewed: 02/25/2017 Elsevier Patient Education  2020 [employer].

## 2019-11-03 NOTE — Progress Notes (Signed)
Contacted patient via telephone call- States baby is nursing well- soaking many diapers and haiving BMs. Patient states she has a lot of help at home. Her flow is scant. Denies depressive thoughts. States she feels she no longer needs zoloft so she stopped taking it. Encouraged patient to reach out to [place] if she has any questions or concerns. Telephone call transferred to [facility] CNM.

## 2019-11-03 NOTE — Progress Notes (Signed)
Virtual Visit via Telephone Note  I connected with Holly Montgomery on 11/03/19 at 1145 by telephone and verified that I am speaking with the correct person using two identifiers.  Location:  Patient: Holly Montgomery  Provider: Gunnar Bulla, CNM   I discussed the limitations, risks, security and privacy concerns of performing an evaluation and management service by telephone and the availability of in person appointments. I also discussed with the patient that there may be a patient responsible charge related to this service. The patient expressed understanding and agreed to proceed.   History of Present Illness:  Patient is two (2) week postpartum after spontaneous vaginal birth of female infant at 37 weeks 5 days.   Doing well, no longer taking Zoloft. History of depression started medication during pregnancy.   Nursing without difficulty. Infant weight checks completed.   Sleeping two (2) to four (4) hours at at time, while baby sleeps.   Attempted intercourse recently.  Denies difficulty breathing or respiratory distress, chest pain, abdominal pain, excessive vaginal bleeding, dysuria, and leg pain or swelling.   Observations/Objective:  Alert and oriented x 4, no apparent distress.   Depression screen [facility] 2/9 11/03/2019 09/12/2019  Decreased Interest 0 0  Down, Depressed, Hopeless 0 0  PHQ - 2 Score 0 0  Altered sleeping 0 0  Tired, decreased energy 2 0  Change in appetite 1 0  Feeling bad or failure about yourself  0 0  Trouble concentrating 0 0  Moving slowly or fidgety/restless 0 0  Suicidal thoughts 0 0  PHQ-9 Score 3 0  Difficult doing work/chores Not difficult at all -    Assessment and Plan:  Depression screen Lactating mother  Follow Up Instructions:    RTC as previously scheduled for PPV or sooner if needed.    I discussed the assessment and treatment plan with the patient. The patient was provided an opportunity to ask questions and all were  answered. The patient agreed with the plan and demonstrated an understanding of the instructions.   The patient was advised to call back or seek an in-person evaluation if the symptoms worsen or if the condition fails to improve as anticipated.  I provided 13 minutes of non-face-to-face time during this encounter.   Gunnar Bulla, CNM [facility], [facility] 11/03/19 11:56 AM

## 2019-11-04 ENCOUNTER — Inpatient Hospital Stay: Admit: 2019-11-04 | Payer: Self-pay

## 2019-11-17 NOTE — Telephone Encounter (Signed)
Please schedule mood check tomorrow. Thanks, JML

## 2019-11-18 ENCOUNTER — Ambulatory Visit (INDEPENDENT_AMBULATORY_CARE_PROVIDER_SITE_OTHER): Payer: Medicaid Other | Admitting: Certified Nurse Midwife

## 2019-11-18 DIAGNOSIS — Z1331 Encounter for screening for depression: Secondary | ICD-10-CM

## 2019-11-18 MED ORDER — FLUOXETINE HCL 10 MG PO CAPS: 10.0000 mg | ORAL_CAPSULE | Freq: Every day | ORAL | 1 refills | Status: DC

## 2019-11-18 NOTE — Progress Notes (Signed)
Virtual Visit via Telephone Note  I connected with Arlana Pouch on 11/18/19 at  3:15 PM EDT by telephone and verified that I am speaking with the correct person using two identifiers.  Location:  Patient: Holly Montgomery  Provider: Gunnar Bulla, CNM   I discussed the limitations, risks, security and privacy concerns of performing an evaluation and management service by telephone and the availability of in person appointments. I also discussed with the patient that there may be a patient responsible charge related to this service. The patient expressed understanding and agreed to proceed.   History of Present Illness:  Reports crying spells for the last week and feeling disconnect from infant/life.   Start last week along with house mate issues.   Contacted by Daiva Huge, Pregnancy Case Manager at [facility], counseling resources.   Denies difficulty breathing or respiratory distress, chest pain, abdominal pain, excessive vaginal bleeding, dysuria, and leg pain or swelling.   No SI/HI.   Observations/Objective:  Depression screen [facility] 2/9 11/18/2019 11/03/2019 09/12/2019  Decreased Interest 2 0 0  Down, Depressed, Hopeless 2 0 0  PHQ - 2 Score 4 0 0  Altered sleeping 1 0 0  Tired, decreased energy 3 2 0  Change in appetite 3 1 0  Feeling bad or failure about yourself  3 0 0  Trouble concentrating 3 0 0  Moving slowly or fidgety/restless 1 0 0  Suicidal thoughts 1 0 0  PHQ-9 Score 19 3 0  Difficult doing work/chores Very difficult Not difficult at all -   GAD 7 : Generalized Anxiety Score 11/18/2019 09/12/2019  Nervous, Anxious, on Edge 1 0  Control/stop worrying 3 0  Worry too much - different things 3 0  Trouble relaxing 3 0  Restless 2 0  Easily annoyed or irritable 2 1  Afraid - awful might happen 3 0  Total GAD 7 Score 17 1  Anxiety Difficulty Extremely difficult -    Assessment and Plan:  Depression-Rx Prozac; Agrees to contact counselor on Monday  Follow Up  Instructions:  Reviewed red flag symptoms and when to call.   RTC as previously scheduled or sooner if needed.    I discussed the assessment and treatment plan with the patient. The patient was provided an opportunity to ask questions and all were answered. The patient agreed with the plan and demonstrated an understanding of the instructions.   The patient was advised to call back or seek an in-person evaluation if the symptoms worsen or if the condition fails to improve as anticipated.  I provided 8 minutes of non-face-to-face time during this encounter.   Gunnar Bulla, CNM [facility], [facility] 11/18/19 3:20 PM

## 2019-11-18 NOTE — Patient Instructions (Signed)

## 2019-11-18 NOTE — Progress Notes (Signed)
Received transferred call from Holly Montgomery for televisit. DOB used as identifier. Patient scored a 19 on PhQ9 and 17 on Gad 7. Is breast feeding and baby is "chunky". Denies vaginal bleeding. Call transferred to [facility] CNM.

## 2019-11-21 ENCOUNTER — Other Ambulatory Visit: Payer: Self-pay

## 2019-11-21 ENCOUNTER — Encounter: Payer: Self-pay | Admitting: *Deleted

## 2019-11-21 DIAGNOSIS — Z79899 Other long term (current) drug therapy: Secondary | ICD-10-CM | POA: Diagnosis not present

## 2019-11-21 DIAGNOSIS — R1011 Right upper quadrant pain: Secondary | ICD-10-CM | POA: Insufficient documentation

## 2019-11-21 DIAGNOSIS — R110 Nausea: Secondary | ICD-10-CM | POA: Insufficient documentation

## 2019-11-21 LAB — COMPREHENSIVE METABOLIC PANEL
ALT: 14 U/L (ref 0–44)
AST: 16 U/L (ref 15–41)
Albumin: 3.7 g/dL (ref 3.5–5.0)
Alkaline Phosphatase: 88 U/L (ref 38–126)
Anion gap: 5 (ref 5–15)
BUN: 15 mg/dL (ref 6–20)
CO2: 27 mmol/L (ref 22–32)
Calcium: 9 mg/dL (ref 8.9–10.3)
Chloride: 111 mmol/L (ref 98–111)
Creatinine, Ser: 0.77 mg/dL (ref 0.44–1.00)
GFR calc Af Amer: >60 mL/min (ref >60)
GFR calc non Af Amer: >60 mL/min (ref >60)
Glucose, Bld: 92 mg/dL (ref 70–99)
Potassium: 3.8 mmol/L (ref 3.5–5.1)
Sodium: 143 mmol/L (ref 135–145)
Total Bilirubin: 1 mg/dL (ref 0.3–1.2)
Total Protein: 7.1 g/dL (ref 6.5–8.1)

## 2019-11-21 LAB — URINALYSIS, COMPLETE (UACMP) WITH MICROSCOPIC
Bacteria, UA: NONE SEEN
Bilirubin Urine: NEGATIVE
Glucose, UA: NEGATIVE mg/dL
Hgb urine dipstick: NEGATIVE
Ketones, ur: NEGATIVE mg/dL
Nitrite: NEGATIVE
Protein, ur: 30 mg/dL — AB
Specific Gravity, Urine: 1.035 — ABNORMAL HIGH (ref 1.005–1.030)
pH: 6 (ref 5.0–8.0)

## 2019-11-21 LAB — CBC
HCT: 37.1 % (ref 36.0–46.0)
Hemoglobin: 11.8 g/dL — ABNORMAL LOW (ref 12.0–15.0)
MCH: 26.9 pg (ref 26.0–34.0)
MCHC: 31.8 g/dL (ref 30.0–36.0)
MCV: 84.5 fL (ref 80.0–100.0)
Platelets: 282 10*3/uL (ref 150–400)
RBC: 4.39 MIL/uL (ref 3.87–5.11)
RDW: 14.2 % (ref 11.5–15.5)
WBC: 6 10*3/uL (ref 4.0–10.5)
nRBC: 0 % (ref 0.0–0.2)

## 2019-11-21 LAB — POCT PREGNANCY, URINE: Preg Test, Ur: NEGATIVE

## 2019-11-21 LAB — LIPASE, BLOOD: Lipase: 27 U/L (ref 11–51)

## 2019-11-21 NOTE — ED Notes (Signed)
Pt. POC resulted Neg. 

## 2019-11-21 NOTE — ED Triage Notes (Addendum)
Pt has right upper abd pain for 1 day.  No n/v.  Pain radiates into mid back.  No n/v/d  No sob.  No chest pain.  Pt had a baby 10/2019.  Pt alert  Speech clear.

## 2019-11-22 ENCOUNTER — Encounter: Payer: Self-pay | Admitting: Radiology

## 2019-11-22 ENCOUNTER — Emergency Department: Payer: Medicaid Other

## 2019-11-22 ENCOUNTER — Emergency Department
Admission: EM | Admit: 2019-11-22 | Discharge: 2019-11-22 | Disposition: A | Discharge status: Home / Self Care | Payer: Medicaid Other | Attending: Emergency Medicine | Admitting: Emergency Medicine

## 2019-11-22 DIAGNOSIS — R1011 Right upper quadrant pain: Secondary | ICD-10-CM

## 2019-11-22 LAB — FIBRIN DERIVATIVES D-DIMER (ARMC ONLY): Fibrin derivatives D-dimer (ARMC): 154.77 ng/mL (FEU) (ref 0.00–499.00)

## 2019-11-22 MED ORDER — ONDANSETRON HCL 4 MG/2ML IJ SOLN
4.0000 mg | Freq: Once | INTRAMUSCULAR | Status: AC
  Administered 2019-11-22: 4 mg via INTRAVENOUS
  Filled 2019-11-22: qty 2

## 2019-11-22 MED ORDER — IOHEXOL 300 MG/ML  SOLN
75.0000 mL | Freq: Once | INTRAMUSCULAR | Status: AC | PRN
  Administered 2019-11-22: 75 mL via INTRAVENOUS

## 2019-11-22 MED ORDER — FENTANYL CITRATE (PF) 100 MCG/2ML IJ SOLN
50.0000 ug | Freq: Once | INTRAMUSCULAR | Status: AC
  Administered 2019-11-22: 50 ug via INTRAVENOUS
  Filled 2019-11-22: qty 2

## 2019-11-22 NOTE — Discharge Instructions (Addendum)
You have been seen in the Emergency Department (ED) for abdominal pain.  Your evaluation did not identify a clear cause of your symptoms but was generally reassuring.  Abdominal pain has many possible causes. Some aren't serious and get better on their own in a few days. Others need more testing and treatment. If your pain continues or gets worse, you need to be rechecked and may need more tests to find out what is wrong. You may need surgery to correct the problem.   Follow up with your doctor in 12-24 hours if you are still having abdominal pain. Otherwise follow up in 1-3 days for a re-check  For pain you may take tylenol or ibuprofen. Put warm compresses.   Don't ignore new symptoms, such as fever, nausea and vomiting, new or worsening abdominal pain, urination problems, bloody diarrhea or bloody stools, black tarry stools, uncontrollable nausea and vomiting, and dizziness. These may be signs of a more serious problem. If you develop any of these you should be seen by your doctor immediately or return to the ED.   How can you care for yourself at home?  Rest until you feel better.  To prevent dehydration, drink plenty of fluids, enough so that your urine is light yellow or clear like water. Choose water and other caffeine-free clear liquids until you feel better. If you have kidney, heart, or liver disease and have to limit fluids, talk with your doctor before you increase the amount of fluids you drink.  If your stomach is upset, eat mild foods, such as rice, dry toast or crackers, bananas, and applesauce. Try eating several small meals instead of two or three large ones.  Wait until 48 hours after all symptoms have gone away before you have spicy foods, alcohol, and drinks that contain caffeine.  Do not eat foods that are high in fat.  Avoid anti-inflammatory medicines such as aspirin, ibuprofen (Advil, Motrin), and naproxen (Aleve). These can cause stomach upset. Talk to your doctor if you  take daily aspirin for another health problem.  When should you call for help?  Call 911 anytime you think you may need emergency care. For example, call if:  You passed out (lost consciousness).  You pass maroon or very bloody stools.  You vomit blood or what looks like coffee grounds.  You have new, severe belly pain.  Call your doctor now or seek immediate medical care if:  Your pain gets worse, especially if it becomes focused in one area of your belly.  You have a new or higher fever.  Your stools are black and look like tar, or they have streaks of blood.  You have unexpected vaginal bleeding.  You have symptoms of a urinary tract infection. These may include:  Pain when you urinate.  Urinating more often than usual.  Blood in your urine. You are dizzy or lightheaded, or you feel like you may faint. Watch closely for changes in your health, and be sure to contact your doctor if:  You are not getting better after 1 day (24 hours).

## 2019-11-22 NOTE — ED Notes (Signed)
Patient returned from CT

## 2019-11-22 NOTE — ED Provider Notes (Signed)
[facility] Emergency Department Provider Note  ____________________________________________  Time seen: Approximately 4:25 AM  I have reviewed the triage vital signs and the nursing notes.   HISTORY  Chief Complaint Abdominal Pain   HPI Holly Montgomery is a 19 y.o. female with a history of anemia, depression, status post spontaneous vaginal delivery  1 month ago who presents for evaluation of abdominal pain.  Pain started yesterday.  She is complaining of 2 days of constant sharp severe right upper quadrant abdominal pain.  The pain is worse with deep inspiration but she denies shortness of breath, chest pain or cough.  No fever or chills, no dysuria or hematuria, no vaginal bleeding or vaginal discharge.  She has had nausea associated with it but no vomiting, no diarrhea, no constipation.  Past Medical History:  Diagnosis Date  . Anemia   . Depression     Past Surgical History:  Procedure Laterality Date  . NO PAST SURGERIES      Prior to Admission medications   Medication Sig Start Date End Date Taking? Authorizing Provider  acetaminophen (TYLENOL) 325 MG tablet Take 2 tablets (650 mg total) by mouth every 4 (four) hours as needed (for pain scale < 4). 10/20/19   Lawhorn, Vanessa [place], CNM  docusate sodium (COLACE) 100 MG capsule Take 1 capsule (100 mg total) by mouth 2 (two) times daily. Patient not taking: Reported on 11/18/2019 10/20/19   Gunnar Bulla, CNM  ferrous sulfate 325 (65 FE) MG tablet Take 1 tablet (325 mg total) by mouth daily with breakfast. 10/21/19   Lawhorn, Vanessa [place], CNM  FLUoxetine (PROZAC) 10 MG capsule Take 1 capsule (10 mg total) by mouth daily. 11/18/19   Lawhorn, Vanessa [place], CNM  medroxyPROGESTERone (DEPO-PROVERA) 150 MG/ML injection Inject 1 mL (150 mg total) into the muscle once for 1 dose. 10/20/19 10/20/19  Gunnar Bulla, CNM  Prenatal Vit-Fe Fumarate-FA (PRENATAL MULTIVITAMIN) TABS tablet  Take 1 tablet by mouth daily at 12 noon.    [provider]    Allergies Motrin [ibuprofen] and Latex  No family history on file.  Social History Social History   Tobacco Use  . Smoking status: Never Smoker  . Smokeless tobacco: Never Used  Substance Use Topics  . Alcohol use: Not Currently  . Drug use: Never    Review of Systems  Constitutional: Negative for fever. Eyes: Negative for visual changes. ENT: Negative for sore throat. Neck: No neck pain  Cardiovascular: Negative for chest pain. Respiratory: Negative for shortness of breath. Gastrointestinal: + RUQ abdominal pain and nausea. No vomiting or diarrhea. Genitourinary: Negative for dysuria. Musculoskeletal: Negative for back pain. Skin: Negative for rash. Neurological: Negative for headaches, weakness or numbness. Psych: No SI or HI  ____________________________________________   PHYSICAL EXAM:  VITAL SIGNS: ED Triage Vitals  Enc Vitals Group     BP 11/21/19 2237 102/67     Pulse Rate 11/21/19 2237 71     Resp 11/21/19 2237 18     Temp 11/21/19 2237 98.3 F (36.8 C)     Temp Source 11/21/19 2237 Oral     SpO2 11/21/19 2237 98 %     Weight 11/21/19 2238 115 lb (52.2 kg)     Height 11/21/19 2238 5\' 4"  (1.626 m)     Head Circumference --      Peak Flow --      Pain Score 11/21/19 2237 7     Pain Loc --  Pain Edu? --      Excl. in [place]? --     Constitutional: Alert and oriented. Well appearing and in no apparent distress. HEENT:      Head: Normocephalic and atraumatic.         Eyes: Conjunctivae are normal. Sclera is non-icteric.       Mouth/Throat: Mucous membranes are moist.       Neck: Supple with no signs of meningismus. Cardiovascular: Regular rate and rhythm. No murmurs, gallops, or rubs. 2+ symmetrical distal pulses are present in all extremities. No JVD. Respiratory: Normal respiratory effort. Lungs are clear to auscultation bilaterally. No wheezes, crackles, or rhonchi.    Gastrointestinal: Soft, tender to palpation the right upper quadrant, and non distended with positive bowel sounds. No rebound or guarding. Genitourinary: No CVA tenderness. Musculoskeletal: Nontender with normal range of motion in all extremities. No edema, cyanosis, or erythema of extremities. Neurologic: Normal speech and language. Face is symmetric. Moving all extremities. No gross focal neurologic deficits are appreciated. Skin: Skin is warm, dry and intact. No rash noted. Psychiatric: Mood and affect are normal. Speech and behavior are normal.  ____________________________________________   LABS (all labs ordered are listed, but only abnormal results are displayed)  Labs Reviewed  CBC - Abnormal; Notable for the following components:      Result Value   Hemoglobin 11.8 (*)    All other components within normal limits  URINALYSIS, COMPLETE (UACMP) WITH MICROSCOPIC - Abnormal; Notable for the following components:   Color, Urine YELLOW (*)    APPearance CLEAR (*)    Specific Gravity, Urine 1.035 (*)    Protein, ur 30 (*)    Leukocytes,Ua TRACE (*)    All other components within normal limits  LIPASE, BLOOD  COMPREHENSIVE METABOLIC PANEL  FIBRIN DERIVATIVES D-DIMER ([facility] ONLY)  POC URINE PREG, ED  POCT PREGNANCY, URINE   ____________________________________________  EKG  none  ____________________________________________  RADIOLOGY  I have personally reviewed the images performed during this visit and I agree with the Radiologist's read.   Interpretation by Radiologist:  CT ABDOMEN PELVIS W CONTRAST  Result Date: 11/22/2019 CLINICAL DATA:  Right upper quadrant abdominal pain for 1 day. Patient is recently postpartum. EXAM: CT ABDOMEN AND PELVIS WITH CONTRAST TECHNIQUE: Multidetector CT imaging of the abdomen and pelvis was performed using the standard protocol following bolus administration of intravenous contrast. CONTRAST:  74mL OMNIPAQUE IOHEXOL 300 MG/ML  SOLN  COMPARISON:  None. FINDINGS: Lower chest: Insert lung bases Hepatobiliary: No focal hepatic lesions or intrahepatic biliary dilatation. The gallbladder is normal. No common bile duct dilatation. Pancreas: No mass, inflammation or ductal dilatation. Spleen: Normal size. No focal lesions. Adrenals/Urinary Tract: The adrenal glands and kidneys are unremarkable. No renal, ureteral or bladder calculi. No CT findings suspicious for pyelonephritis. Stomach/Bowel: The stomach, duodenum, small bowel and colon are grossly normal without oral contrast. No acute inflammatory process, mass lesion or obstructive findings. The appendix is not identified for certain but I do not see any definite findings to suggest acute appendicitis. Vascular/Lymphatic: The aorta is normal in caliber. No dissection. The branch vessels are patent. The major venous structures are patent. No mesenteric or retroperitoneal mass or adenopathy. Small scattered lymph nodes are noted. Reproductive: The uterus and ovaries are unremarkable. Other: No pelvic mass or adenopathy. No free pelvic fluid collections. No inguinal mass or adenopathy. No abdominal wall hernia or subcutaneous lesions. Musculoskeletal: No significant bony findings. IMPRESSION: 1. No acute abdominal/pelvic findings, mass lesion or adenopathy. 2.  No renal, ureteral or bladder calculi or evidence of pyelonephritis. Electronically Signed   By: Rudie Meyer M.D.   On: 11/22/2019 06:50   DG Abdomen Acute W/Chest  Result Date: 11/22/2019 CLINICAL DATA:  Right upper quadrant abdominal pain for 1 day. EXAM: DG ABDOMEN ACUTE W/ 1V CHEST COMPARISON:  None. FINDINGS: The upright chest x-ray demonstrates normal cardiomediastinal contours. Minimal streaky right basilar atelectasis but no infiltrates, edema or effusions. Scattered air and stool in the colon and down into the rectum. No distended small bowel loops to suggest obstruction. The soft tissue shadows of the abdomen are maintained. No  worrisome calcifications. The bony structures are unremarkable. IMPRESSION: 1. Minimal streaky right basilar atelectasis. 2. No plain film findings for an acute abdominal process. Electronically Signed   By: Rudie Meyer M.D.   On: 11/22/2019 05:03   US ABDOMEN LIMITED RUQ  Result Date: 11/22/2019 CLINICAL DATA:  Right upper quadrant pain for 1 day. EXAM: ULTRASOUND ABDOMEN LIMITED RIGHT UPPER QUADRANT COMPARISON:  None. FINDINGS: Gallbladder: Physiologically distended. No gallstones or wall thickening visualized. No sonographic Murphy sign noted by sonographer. Common bile duct: Diameter: 2 mm, normal. Liver: No focal lesion identified. Within normal limits in parenchymal echogenicity. Portal vein is patent on color Doppler imaging with normal direction of blood flow towards the liver. Other: None. IMPRESSION: Normal right upper quadrant ultrasound. Electronically Signed   By: Narda Rutherford M.D.   On: 11/22/2019 03:25     ____________________________________________   PROCEDURES  Procedure(s) performed: None Procedures Critical Care performed:  None ____________________________________________   INITIAL IMPRESSION / ASSESSMENT AND PLAN / ED COURSE  19 y.o. female with a history of anemia, depression, status post spontaneous vaginal delivery  1 month ago who presents for evaluation of RUQ abdominal pain and nausea.  Patient is well-appearing in no distress with normal vital signs, abdomen is soft with right upper quadrant tenderness, no rebound or guarding, no lower abdominal tenderness.  Differential diagnosis including gallbladder disease versus pancreatitis versus gastritis versus peptic ulcer disease versus appendicitis versus kidney stone versus UTI versus pyelonephritis versus PE.  UA negative for UTI.  Pregnancy test negative.  No leukocytosis, normal LFTs and lipase.  Right upper quadrant ultrasound visualized and interpreted by me is negative with no evidence of stones or acute  cholecystitis.  Read confirmed by radiologist. KUB visualized and read by me showing no acute abnormalities.   Patient has hives to NSAIDs therefore she was given IV fentanyl for pain and zofran for nausea.   _________________________ 6:57 AM on 11/22/2019 -----------------------------------------  After receiving IV fentanyl patient was still complaining of 9 out of 10 pain with normal ultrasound, normal labs, and normal KUB.  Therefore she was sent for a CT abdomen pelvis which is unremarkable.  Her D-dimer is negative ruling out PE.  She remains extremely well-appearing.  Possibly MSK pain.  Recommended Tylenol or ibuprofen and warm compresses.  Follow-up with PCP.  Discussed my standard return precautions.  old medical records reviewed.   _____________________________________________ Please note:  Patient was evaluated in Emergency Department today for the symptoms described in the history of present illness. Patient was evaluated in the context of the global COVID-19 pandemic, which necessitated consideration that the patient might be at risk for infection with the SARS-CoV-2 virus that causes COVID-19. Institutional protocols and algorithms that pertain to the evaluation of patients at risk for COVID-19 are in a state of rapid change based on information released by regulatory bodies including the CDC and  federal and state organizations. These policies and algorithms were followed during the patient's care in the ED.  Some ED evaluations and interventions may be delayed as a result of limited staffing during the pandemic.   [place] Controlled Substance Database was reviewed by me. ____________________________________________   FINAL CLINICAL IMPRESSION(S) / ED DIAGNOSES   Final diagnoses:  Right upper quadrant abdominal pain      NEW MEDICATIONS STARTED DURING THIS VISIT:  ED Discharge Orders    None       Note:  This document was prepared using Dragon voice recognition software  and may include unintentional dictation errors.    Don Perking, [place], MD 11/22/19 [id number]

## 2019-11-22 NOTE — ED Notes (Signed)
Patient transported to x-ray. ?

## 2019-11-30 ENCOUNTER — Encounter: Payer: Self-pay | Admitting: Certified Nurse Midwife

## 2019-11-30 ENCOUNTER — Ambulatory Visit (INDEPENDENT_AMBULATORY_CARE_PROVIDER_SITE_OTHER): Payer: Medicaid Other | Admitting: Certified Nurse Midwife

## 2019-11-30 ENCOUNTER — Other Ambulatory Visit: Payer: Self-pay

## 2019-11-30 NOTE — Patient Instructions (Signed)
Preventive Care 21-21 Years Old, Female Preventive care refers to visits with your health care provider and lifestyle choices that can promote health and wellness. This includes:  A yearly physical exam. This may also be called an annual well check.  Regular dental visits and eye exams.  Immunizations.  Screening for certain conditions.  Healthy lifestyle choices, such as eating a healthy diet, getting regular exercise, not using drugs or products that contain nicotine and tobacco, and limiting alcohol use. What can I expect for my preventive care visit? Physical exam Your health care provider will check your:  Height and weight. This may be used to calculate body mass index (BMI), which tells if you are at a healthy weight.  Heart rate and blood pressure.  Skin for abnormal spots. Counseling Your health care provider may ask you questions about your:  Alcohol, tobacco, and drug use.  Emotional well-being.  Home and relationship well-being.  Sexual activity.  Eating habits.  Work and work environment.  Method of birth control.  Menstrual cycle.  Pregnancy history. What immunizations do I need?  Influenza (flu) vaccine  This is recommended every year. Tetanus, diphtheria, and pertussis (Tdap) vaccine  You may need a Td booster every 10 years. Varicella (chickenpox) vaccine  You may need this if you have not been vaccinated. Human papillomavirus (HPV) vaccine  If recommended by your health care provider, you may need three doses over 6 months. Measles, mumps, and rubella (MMR) vaccine  You may need at least one dose of MMR. You may also need a second dose. Meningococcal conjugate (MenACWY) vaccine  One dose is recommended if you are age 19-21 years and a first-year college student living in a residence hall, or if you have one of several medical conditions. You may also need additional booster doses. Pneumococcal conjugate (PCV13) vaccine  You may need  this if you have certain conditions and were not previously vaccinated. Pneumococcal polysaccharide (PPSV23) vaccine  You may need one or two doses if you smoke cigarettes or if you have certain conditions. Hepatitis A vaccine  You may need this if you have certain conditions or if you travel or work in places where you may be exposed to hepatitis A. Hepatitis B vaccine  You may need this if you have certain conditions or if you travel or work in places where you may be exposed to hepatitis B. Haemophilus influenzae type b (Hib) vaccine  You may need this if you have certain conditions. You may receive vaccines as individual doses or as more than one vaccine together in one shot (combination vaccines). Talk with your health care provider about the risks and benefits of combination vaccines. What tests do I need?  Blood tests  Lipid and cholesterol levels. These may be checked every 5 years starting at age 20.  Hepatitis C test.  Hepatitis B test. Screening  Diabetes screening. This is done by checking your blood sugar (glucose) after you have not eaten for a while (fasting).  Sexually transmitted disease (STD) testing.  BRCA-related cancer screening. This may be done if you have a family history of breast, ovarian, tubal, or peritoneal cancers.  Pelvic exam and Pap test. This may be done every 3 years starting at age 21. Starting at age 30, this may be done every 5 years if you have a Pap test in combination with an HPV test. Talk with your health care provider about your test results, treatment options, and if necessary, the need for more tests.   Follow these instructions at home: Eating and drinking   Eat a diet that includes fresh fruits and vegetables, whole grains, lean protein, and low-fat dairy.  Take vitamin and mineral supplements as recommended by your health care provider.  Do not drink alcohol if: ? Your health care provider tells you not to drink. ? You are  pregnant, may be pregnant, or are planning to become pregnant.  If you drink alcohol: ? Limit how much you have to 0-1 drink a day. ? Be aware of how much alcohol is in your drink. In the [place] one drink equals one 12 oz bottle of beer (355 mL), one 5 oz glass of wine (148 mL), or one 1 oz glass of hard liquor (44 mL). Lifestyle  Take daily care of your teeth and gums.  Stay active. Exercise for at least 30 minutes on 5 or more days each week.  Do not use any products that contain nicotine or tobacco, such as cigarettes, e-cigarettes, and chewing tobacco. If you need help quitting, ask your health care provider.  If you are sexually active, practice safe sex. Use a condom or other form of birth control (contraception) in order to prevent pregnancy and STIs (sexually transmitted infections). If you plan to become pregnant, see your health care provider for a preconception visit. What's next?  Visit your health care provider once a year for a well check visit.  Ask your health care provider how often you should have your eyes and teeth checked.  Stay up to date on all vaccines. This information is not intended to replace advice given to you by your health care provider. Make sure you discuss any questions you have with your health care provider. Document Revised: 04/01/2018 Document Reviewed: 04/01/2018 Elsevier Patient Education  2020 [employer].

## 2019-11-30 NOTE — Progress Notes (Signed)
Subjective:    Holly Montgomery is a 19 y.o. G16P1011 female who presents for a postpartum visit. She is 6 weeks postpartum following a spontaneous vaginal delivery at 37.5  gestational weeks. Anesthesia: epidural. I have fully reviewed the prenatal and intrapartum course. Postpartum course has been WNL. Baby's course has been WNL. Baby is feeding by breast. Bleeding no bleeding. Bowel function is normal. Bladder function is normal. Patient is not sexually active.. Contraception method is Depo-Provera injections. Postpartum depression screening: positive. Score 20.  She started prozac 1 wk ago. She has resources for counseling and is declines referral at this time. Last pap has never had. Will do in 6 months.   The following portions of the patient's history were reviewed and updated as appropriate: allergies, current medications, past medical history, past surgical history and problem list.  Review of Systems Pertinent items are noted in HPI.   There were no vitals filed for this visit. Patient's last menstrual period was 01/28/2019 (lmp unknown).  Objective:   General:  alert, cooperative and no distress   Breasts:  deferred, no complaints  Lungs: clear to auscultation bilaterally  Heart:  regular rate and rhythm  Abdomen: soft, nontender   Vulva: normal  Vagina: normal vagina  Cervix:  closed  Corpus: Well-involuted  Adnexa:  Non-palpable  Rectal Exam: no hemorrhoids        Assessment:   Postpartum exam 6 wks s/p SVD Breastfeeding Depression screening Contraception counseling   Plan:  : Depo-Provera injections Follow up in 3 wks for mood check,: 3 months for depo injection, 6 months for annual exam or earlier if needed  Doreene Burke, CNM

## 2019-12-01 ENCOUNTER — Encounter: Payer: Medicaid Other | Admitting: Certified Nurse Midwife

## 2019-12-20 ENCOUNTER — Telehealth: Payer: Self-pay | Admitting: Certified Nurse Midwife

## 2020-01-05 ENCOUNTER — Ambulatory Visit (INDEPENDENT_AMBULATORY_CARE_PROVIDER_SITE_OTHER): Payer: Medicaid Other | Admitting: Surgical

## 2020-01-05 ENCOUNTER — Other Ambulatory Visit: Payer: Self-pay

## 2020-01-05 DIAGNOSIS — Z3042 Encounter for surveillance of injectable contraceptive: Secondary | ICD-10-CM

## 2020-01-05 DIAGNOSIS — Z309 Encounter for contraceptive management, unspecified: Secondary | ICD-10-CM | POA: Diagnosis not present

## 2020-01-05 MED ORDER — MEDROXYPROGESTERONE ACETATE 150 MG/ML IM SUSP
150.0000 mg | Freq: Once | INTRAMUSCULAR | Status: AC
  Administered 2020-01-05: 150 mg via INTRAMUSCULAR

## 2020-01-05 NOTE — Progress Notes (Signed)
Date last pap: NA Last Depo-Provera: 10/20/19 Side Effects if any: NA Serum HCG indicated? NA  Depo-Provera 150 mg IM given by: J. [facility] CMA Next appointment due 03/22/20-04/05/20

## 2020-01-11 NOTE — Telephone Encounter (Signed)
Error

## 2020-02-01 ENCOUNTER — Other Ambulatory Visit (INDEPENDENT_AMBULATORY_CARE_PROVIDER_SITE_OTHER): Payer: Self-pay

## 2020-02-01 ENCOUNTER — Other Ambulatory Visit: Payer: Self-pay

## 2020-02-01 MED ORDER — MEDROXYPROGESTERONE ACETATE 150 MG/ML IM SUSP: 150.0000 mg | INTRAMUSCULAR | 3 refills | Status: DC

## 2020-02-01 NOTE — Telephone Encounter (Signed)
Depo refill sent per request

## 2020-02-28 ENCOUNTER — Emergency Department
Admission: EM | Admit: 2020-02-28 | Discharge: 2020-02-28 | Discharge status: Absent without leave | Payer: Medicaid Other

## 2020-02-28 ENCOUNTER — Other Ambulatory Visit: Payer: Self-pay

## 2020-02-29 ENCOUNTER — Telehealth: Payer: Self-pay | Admitting: Certified Nurse Midwife

## 2020-02-29 NOTE — Telephone Encounter (Signed)
Called pt and lmtrc for her nurse appt. Move her appt

## 2020-03-19 ENCOUNTER — Other Ambulatory Visit: Payer: Self-pay

## 2020-03-19 ENCOUNTER — Ambulatory Visit (INDEPENDENT_AMBULATORY_CARE_PROVIDER_SITE_OTHER): Payer: Medicaid Other

## 2020-03-19 ENCOUNTER — Ambulatory Visit: Payer: Medicaid Other

## 2020-03-19 DIAGNOSIS — Z3042 Encounter for surveillance of injectable contraceptive: Secondary | ICD-10-CM | POA: Diagnosis not present

## 2020-03-19 MED ORDER — MEDROXYPROGESTERONE ACETATE 150 MG/ML IM SUSP
150.0000 mg | Freq: Once | INTRAMUSCULAR | Status: AC
  Administered 2020-03-19: 150 mg via INTRAMUSCULAR

## 2020-03-19 NOTE — Progress Notes (Signed)
Date last pap:due Last Depo-Provera: 01/05/2020 Side Effects if any: none Serum HCG indicated? n/a Depo-Provera 150 mg IM given by: FHampton, LPN.  Next appointment due November 1-15, 2021.

## 2020-03-21 ENCOUNTER — Ambulatory Visit: Payer: Medicaid Other

## 2020-03-28 ENCOUNTER — Emergency Department
Admission: EM | Admit: 2020-03-28 | Discharge: 2020-03-28 | Disposition: A | Discharge status: Home / Self Care | Payer: Medicaid Other | Attending: Emergency Medicine | Admitting: Emergency Medicine

## 2020-03-28 ENCOUNTER — Other Ambulatory Visit: Payer: Self-pay

## 2020-03-28 DIAGNOSIS — H00011 Hordeolum externum right upper eyelid: Secondary | ICD-10-CM | POA: Diagnosis not present

## 2020-03-28 NOTE — ED Provider Notes (Signed)
[facility] Emergency Department Provider Note  ____________________________________________  Time seen: Approximately 9:03 PM  I have reviewed the triage vital signs and the nursing notes.   HISTORY  Chief Complaint Stye    HPI Holly Montgomery is a 19 y.o. female patient presented to emergency department with a stye in the right upper eyelid.  She states that she knows exactly what it is, went to work today when he sought they thought it was "pinkeye" and told her that she had to have a note to return to work.  Patient is here for return to work note only.  She states that she knows to use warm compress on the area.  She states that she gets these all the time as she typically falls asleep with her make-up on.  Patient has no visual changes.  No other complaints.         Past Medical History:  Diagnosis Date  . Anemia   . Depression     There are no problems to display for this patient.   Past Surgical History:  Procedure Laterality Date  . NO PAST SURGERIES      Prior to Admission medications   Medication Sig Start Date End Date Taking? Authorizing Provider  acetaminophen (TYLENOL) 325 MG tablet Take 2 tablets (650 mg total) by mouth every 4 (four) hours as needed (for pain scale < 4). 10/20/19   Lawhorn, Vanessa [place], CNM  cyclobenzaprine (FLEXERIL) 10 MG tablet Take 10 mg by mouth 3 (three) times daily as needed for muscle spasms.    [provider]  ferrous sulfate 325 (65 FE) MG tablet Take 1 tablet (325 mg total) by mouth daily with breakfast. 10/21/19   Lawhorn, Vanessa [place], CNM  medroxyPROGESTERone (DEPO-PROVERA) 150 MG/ML injection Inject 1 mL (150 mg total) into the muscle every 3 (three) months. 02/01/20   Gunnar Bulla, CNM  Prenatal Vit-Fe Fumarate-FA (PRENATAL MULTIVITAMIN) TABS tablet Take 1 tablet by mouth daily at 12 noon.    [provider]    Allergies Motrin [ibuprofen] and Latex  No family  history on file.  Social History Social History   Tobacco Use  . Smoking status: Never Smoker  . Smokeless tobacco: Never Used  Vaping Use  . Vaping Use: Never used  Substance Use Topics  . Alcohol use: Not Currently  . Drug use: Never     Review of Systems  Constitutional: No fever/chills Eyes: Stye to the right upper eyelid ENT: No upper respiratory complaints. Cardiovascular: no chest pain. Respiratory: no cough. No SOB. Gastrointestinal: No abdominal pain.  No nausea, no vomiting.  No diarrhea.  No constipation. Musculoskeletal: Negative for musculoskeletal pain. Skin: Negative for rash, abrasions, lacerations, ecchymosis. Neurological: Negative for headaches, focal weakness or numbness. 10-point ROS otherwise negative.  ____________________________________________   PHYSICAL EXAM:  VITAL SIGNS: ED Triage Vitals [03/28/20 1927]  Enc Vitals Group     BP (!) 152/98     Pulse Rate 96     Resp 20     Temp 98.8 F (37.1 C)     Temp Source Oral     SpO2 98 %     Weight 105 lb (47.6 kg)     Height 5\' 3"  (1.6 m)     Head Circumference      Peak Flow      Pain Score 7     Pain Loc      Pain Edu?      Excl. in GC?  Constitutional: Alert and oriented. Well appearing and in no acute distress. Eyes: Conjunctivae are normal. PERRL. EOMI. stye noted to the left upper eyelid.  No surrounding erythema or edema.  No purulent drainage.  Conjunctive a is nonerythematous. Head: Atraumatic. ENT:      Ears:       Nose: No congestion/rhinnorhea.      Mouth/Throat: Mucous membranes are moist.  Neck: No stridor.    Cardiovascular: Normal rate, regular rhythm. Normal S1 and S2.  Good peripheral circulation. Respiratory: Normal respiratory effort without tachypnea or retractions. Lungs CTAB. Good air entry to the bases with no decreased or absent breath sounds. Musculoskeletal: Full range of motion to all extremities. No gross deformities appreciated. Neurologic:  Normal  speech and language. No gross focal neurologic deficits are appreciated.  Skin:  Skin is warm, dry and intact. No rash noted. Psychiatric: Mood and affect are normal. Speech and behavior are normal. Patient exhibits appropriate insight and judgement.   ____________________________________________   LABS (all labs ordered are listed, but only abnormal results are displayed)  Labs Reviewed - No data to display ____________________________________________  EKG   ____________________________________________  RADIOLOGY   No results found.  ____________________________________________    PROCEDURES  Procedure(s) performed:    Procedures    Medications - No data to display   ____________________________________________   INITIAL IMPRESSION / ASSESSMENT AND PLAN / ED COURSE  Pertinent labs & imaging results that were available during my care of the patient were reviewed by me and considered in my medical decision making (see chart for details).  Review of the [place] CSRS was performed in accordance of the [facility] prior to dispensing any controlled drugs.           Patient's diagnosis is consistent with stye to the right upper eyelid.  Exam is reassuring.  Patient will use warm compresses to relieve symptoms.  Patient needed a note to return to work.  This is provided to the patient.  Follow-up with primary care as needed..  Patient is given ED precautions to return to the ED for any worsening or new symptoms.     ____________________________________________  FINAL CLINICAL IMPRESSION(S) / ED DIAGNOSES  Final diagnoses:  Hordeolum externum of right upper eyelid      NEW MEDICATIONS STARTED DURING THIS VISIT:  ED Discharge Orders    None          This chart was dictated using voice recognition software/Dragon. Despite best efforts to proofread, errors can occur which can change the meaning. Any change was purely unintentional.    Lanette Hampshire 03/28/20 2111    Phineas Semen, MD 03/28/20 2229

## 2020-03-28 NOTE — ED Triage Notes (Signed)
Pt states she noticed a stye to inner upper lid of right eye, states works at [employer] and they need a note saying she can work.

## 2020-05-13 IMAGING — CT CT ABD-PELV W/ CM
2 of 4 series · 16 of 46 positions shown, 18 images · IV contrast (APPLIED)
Comparison: None.

CLINICAL DATA: Right upper quadrant abdominal pain for 1 day.
Patient is recently postpartum.

EXAM:
CT ABDOMEN AND PELVIS WITH CONTRAST
TECHNIQUE: Multidetector CT imaging of the abdomen and pelvis was performed
using the standard protocol following bolus administration of
intravenous contrast.
CONTRAST:  75mL OMNIPAQUE IOHEXOL 300 MG/ML  SOLN

[Series 2: routine abd/pel with · axial · 0.63mm/px · z∈[-920,-560]mm · 13 of 80 slices shown, 15 images]
[im 4/80  soft-tissue]
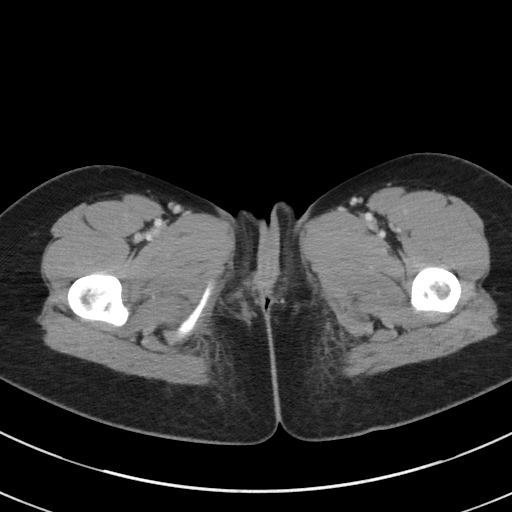
[im 4/80  bone]
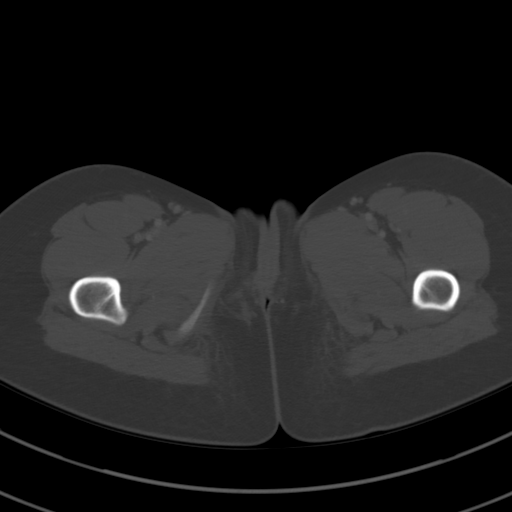
[im 10/80  soft-tissue]
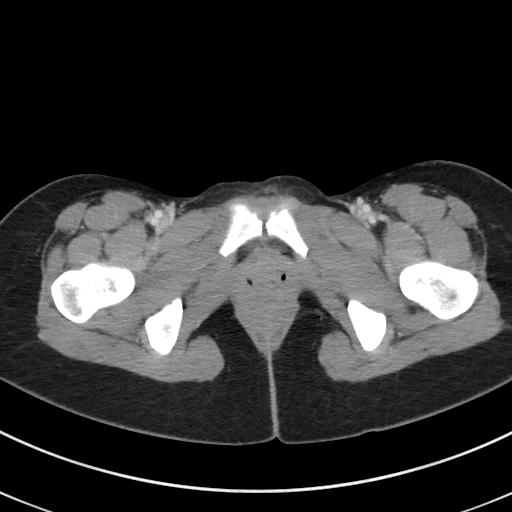
[im 16/80  soft-tissue]
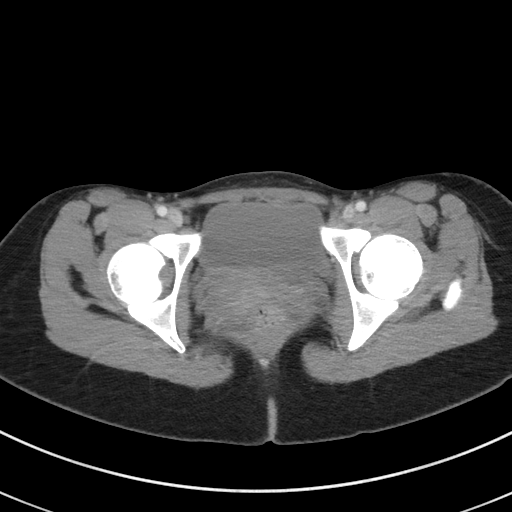
[im 23/80  soft-tissue]
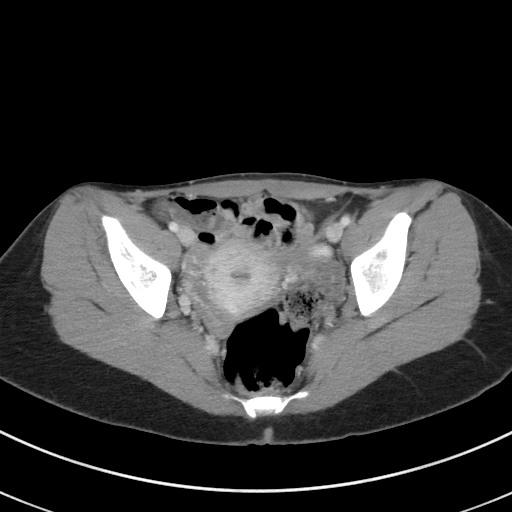
[im 29/80  soft-tissue]
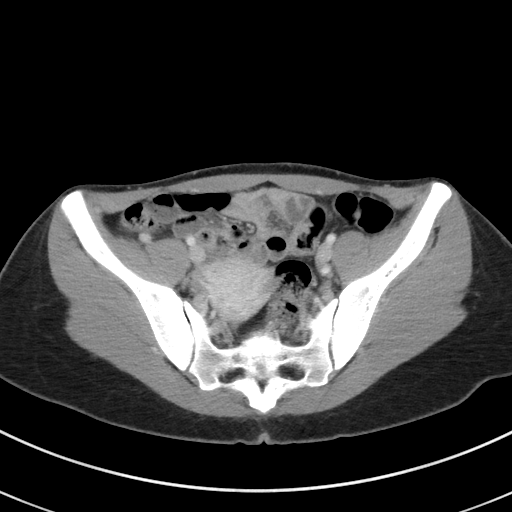
[im 35/80  soft-tissue]
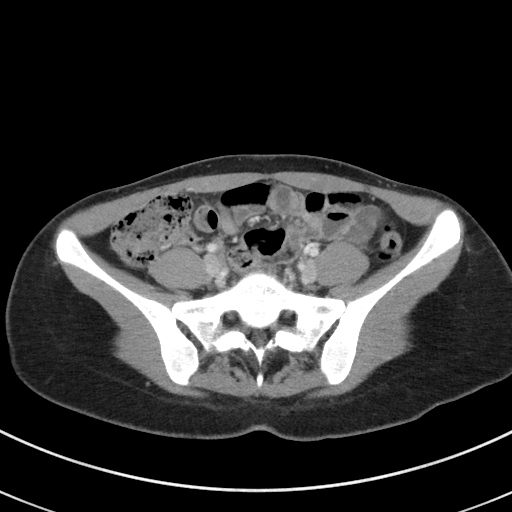
[im 42/80  soft-tissue]
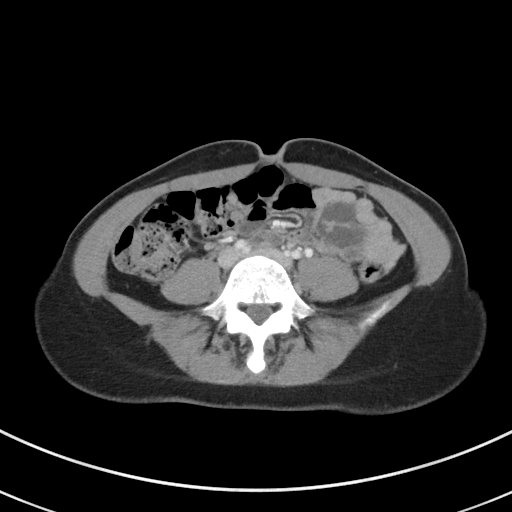
[im 45/80  soft-tissue]
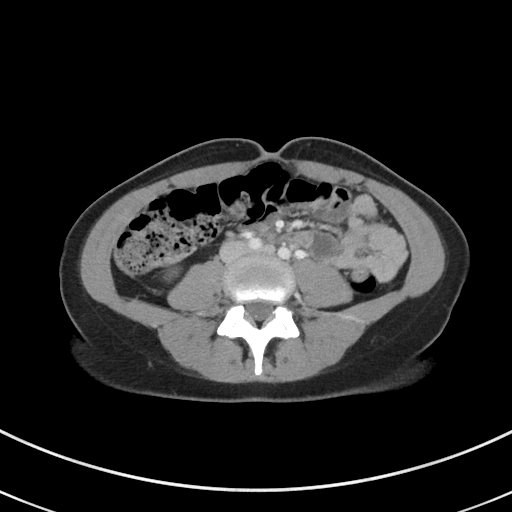
[im 51/80  soft-tissue]
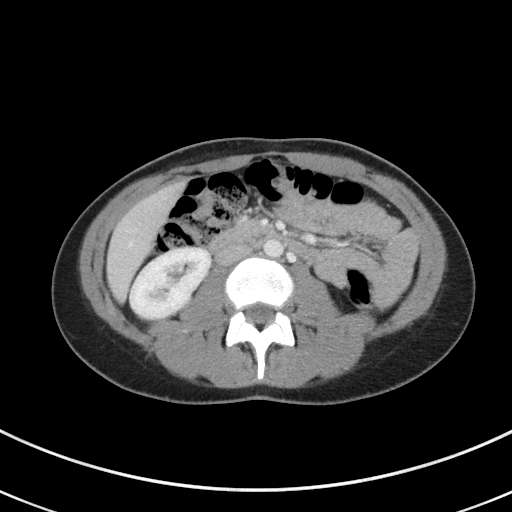
[im 51/80  bone]
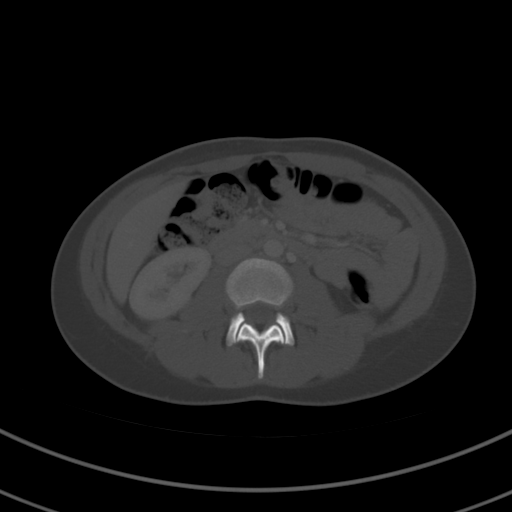
[im 57/80  soft-tissue]
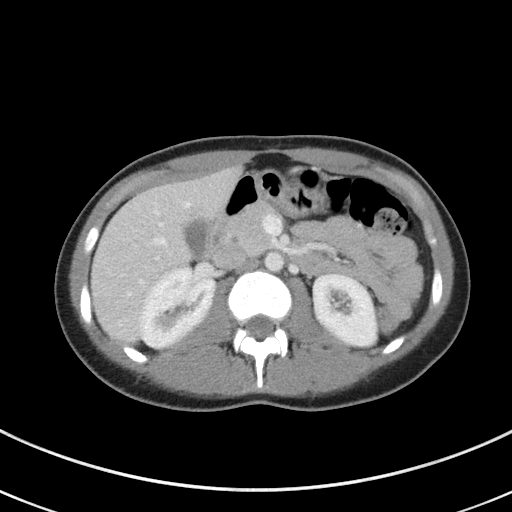
[im 64/80  soft-tissue]
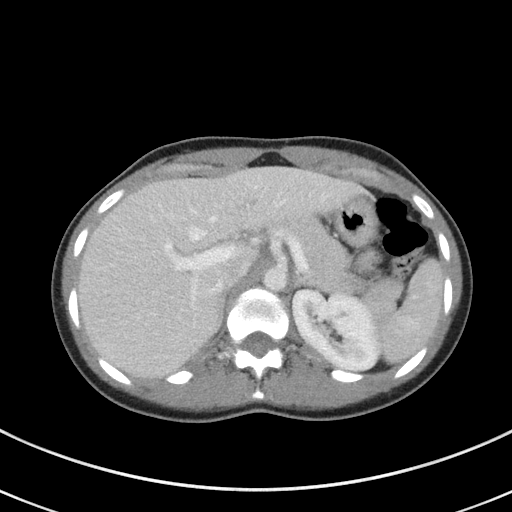
[im 70/80  soft-tissue]
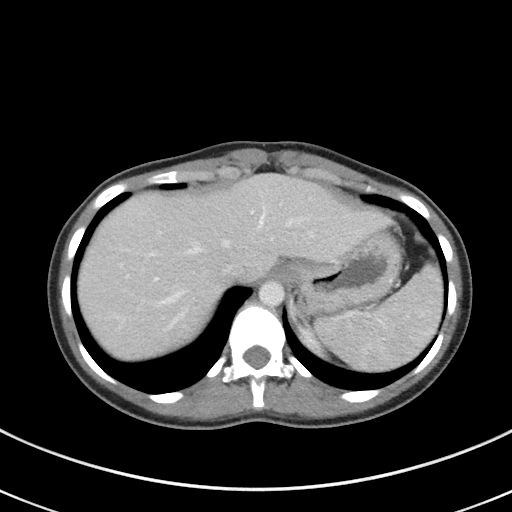
[im 76/80  soft-tissue]
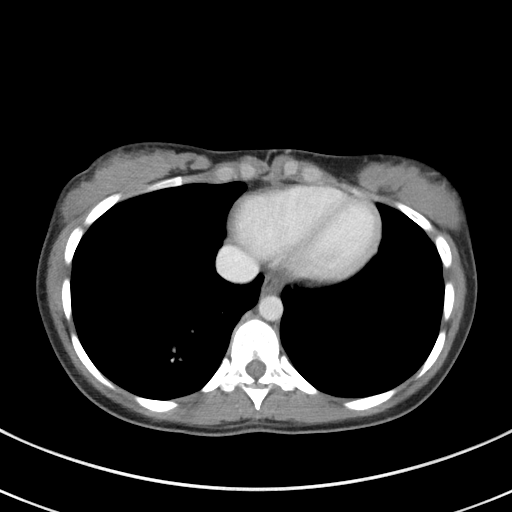

[Series 5: coronal st · coronal · 0.60mm/px · 3 of 58 slices shown]
[im 20/58  soft-tissue]
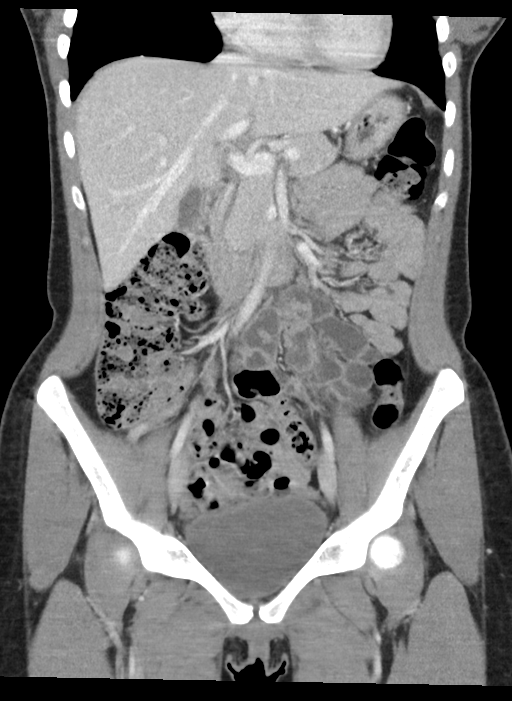
[im 26/58  soft-tissue]
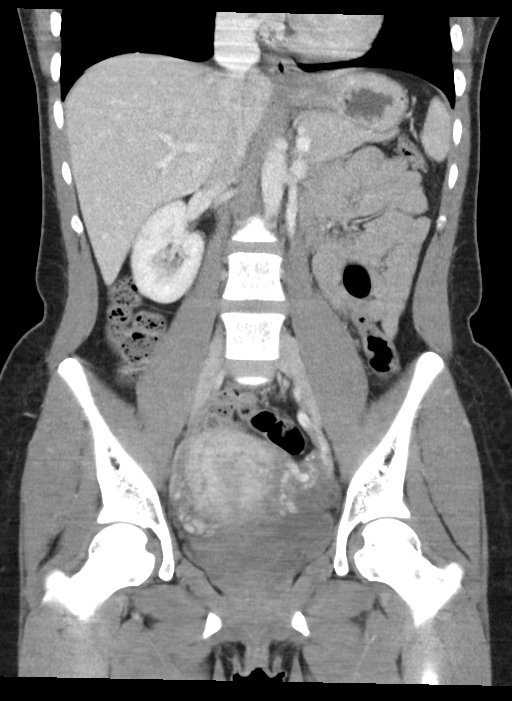
[im 32/58  soft-tissue]
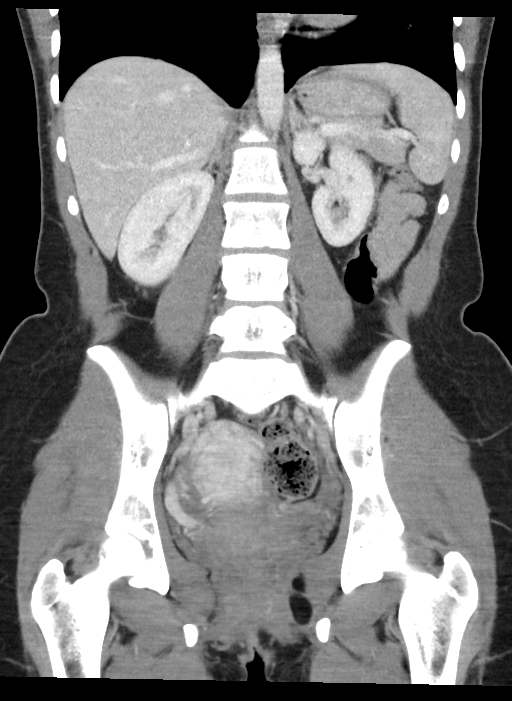

[16 of 46 positions shown; findings below may reference images not displayed]

FINDINGS: Lower chest: Insert lung bases

Hepatobiliary: No focal hepatic lesions or intrahepatic biliary
dilatation. The gallbladder is normal. No common bile duct
dilatation.

Pancreas: No mass, inflammation or ductal dilatation.

Spleen: Normal size. No focal lesions.

Adrenals/Urinary Tract: The adrenal glands and kidneys are
unremarkable. No renal, ureteral or bladder calculi. No CT findings
suspicious for pyelonephritis.

Stomach/Bowel: The stomach, duodenum, small bowel and colon are
grossly normal without oral contrast. No acute inflammatory process,
mass lesion or obstructive findings. The appendix is not identified
for certain but I do not see any definite findings to suggest acute
appendicitis.

Vascular/Lymphatic: The aorta is normal in caliber. No dissection.
The branch vessels are patent. The major venous structures are
patent. No mesenteric or retroperitoneal mass or adenopathy. Small
scattered lymph nodes are noted.

Reproductive: The uterus and ovaries are unremarkable.

Other: No pelvic mass or adenopathy. No free pelvic fluid
collections. No inguinal mass or adenopathy. No abdominal wall
hernia or subcutaneous lesions.

Musculoskeletal: No significant bony findings.
IMPRESSION: 1. No acute abdominal/pelvic findings, mass lesion or adenopathy.
2. No renal, ureteral or bladder calculi or evidence of
pyelonephritis.

## 2020-05-13 IMAGING — CR DG ABDOMEN ACUTE W/ 1V CHEST
3 series · 3 of 3 positions shown · non-contrast
Comparison: None.

CLINICAL DATA: Right upper quadrant abdominal pain for 1 day.

EXAM:
DG ABDOMEN ACUTE W/ 1V CHEST

[chest pa]
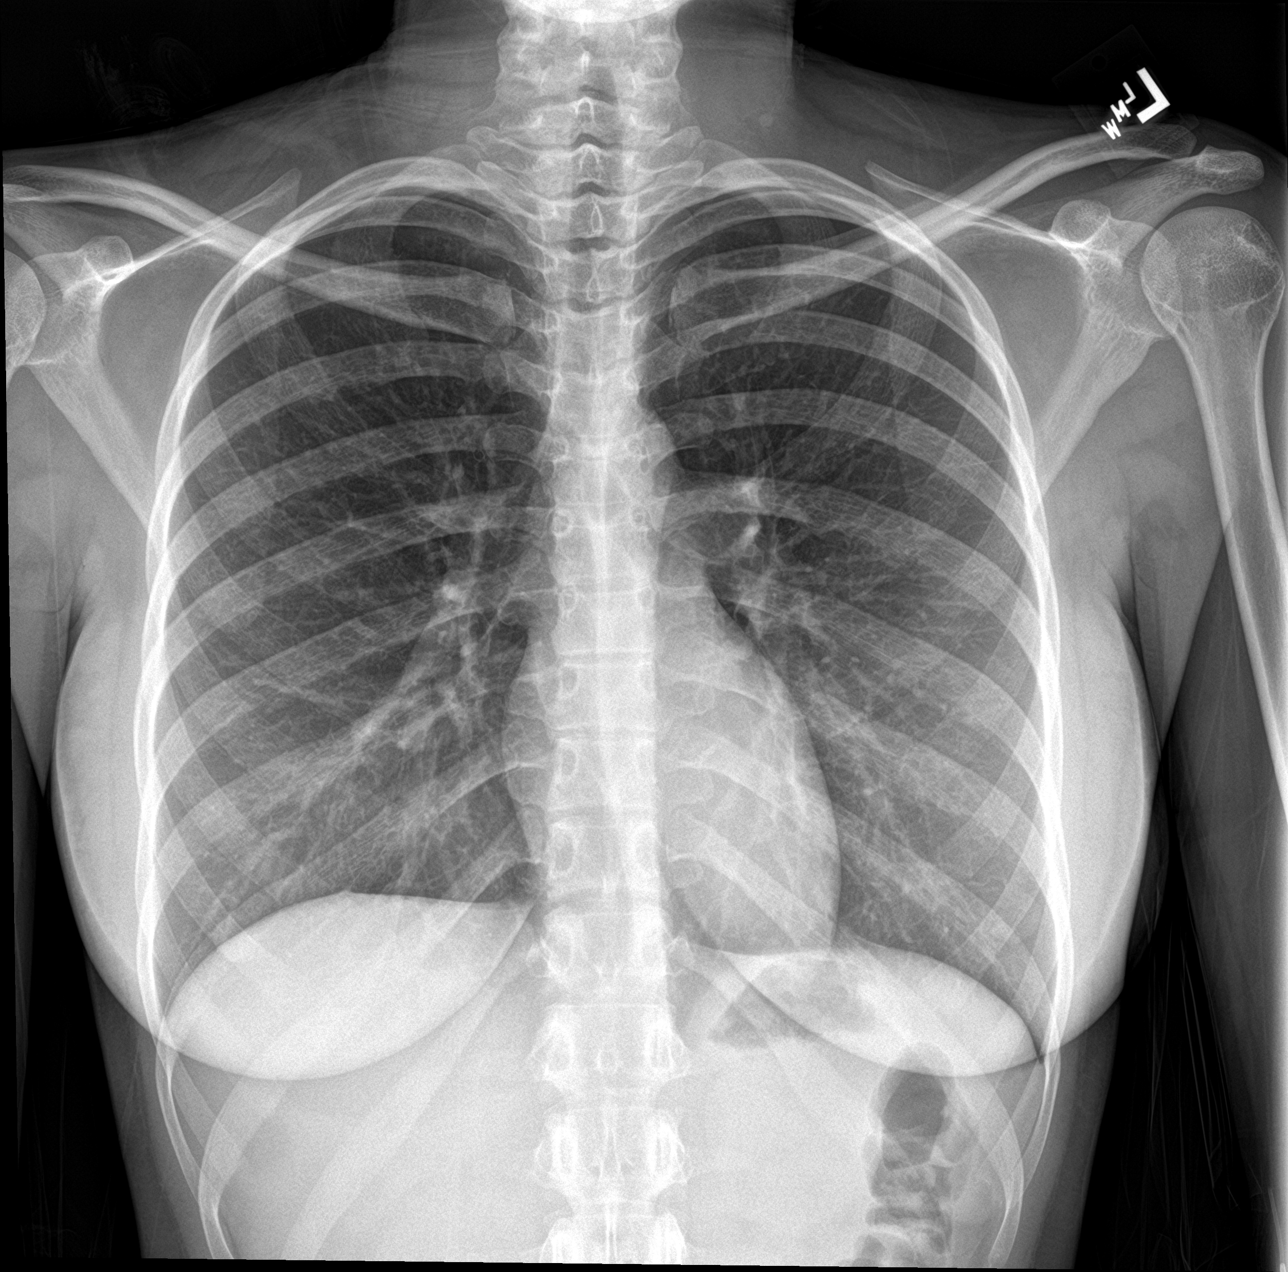

[abdomen erect (1 of 2)]
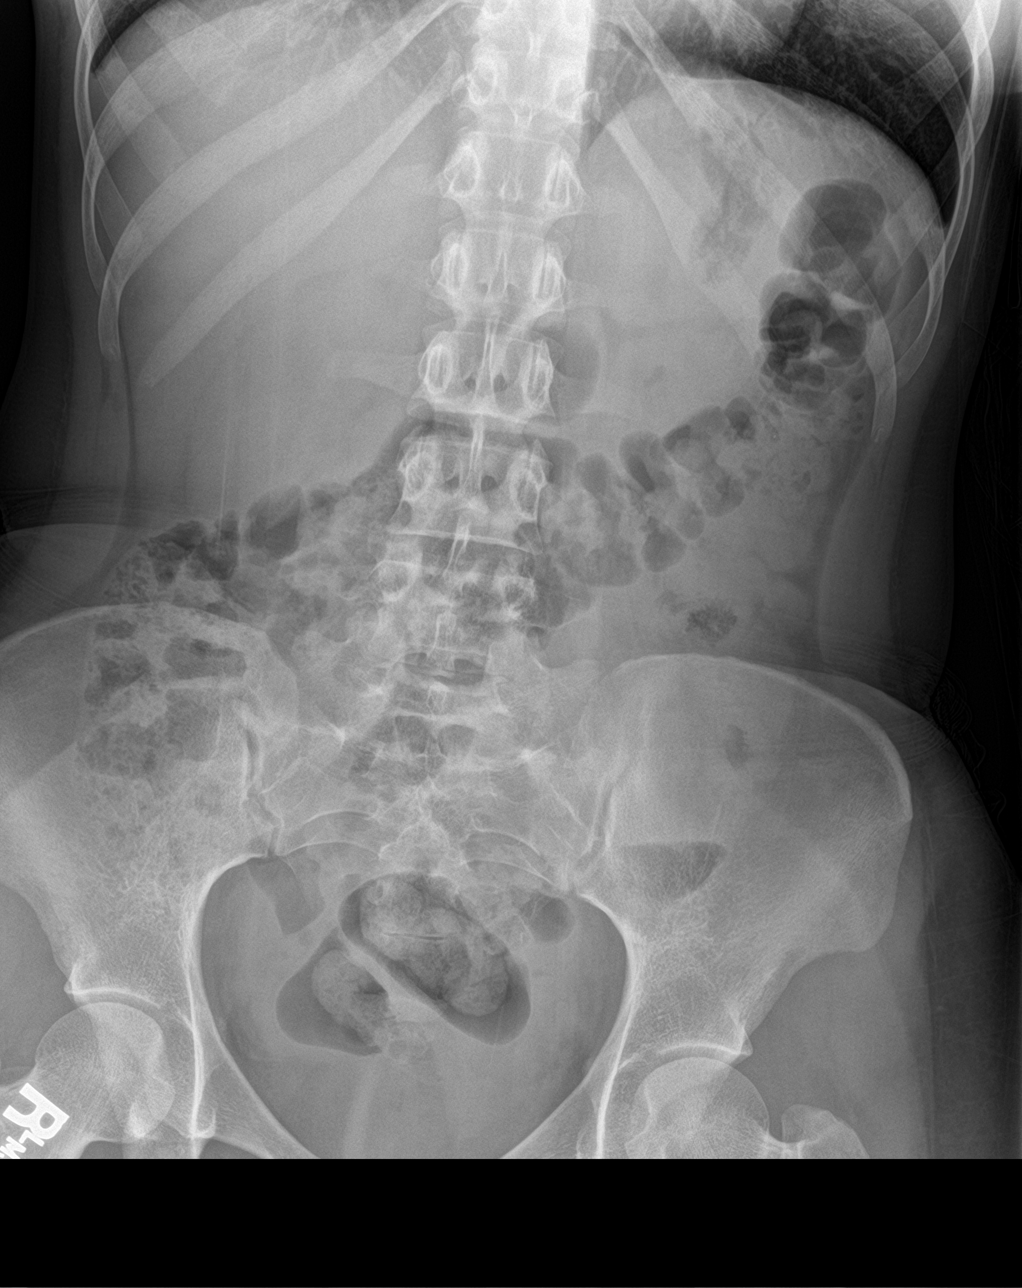

[abdomen erect (2 of 2)]
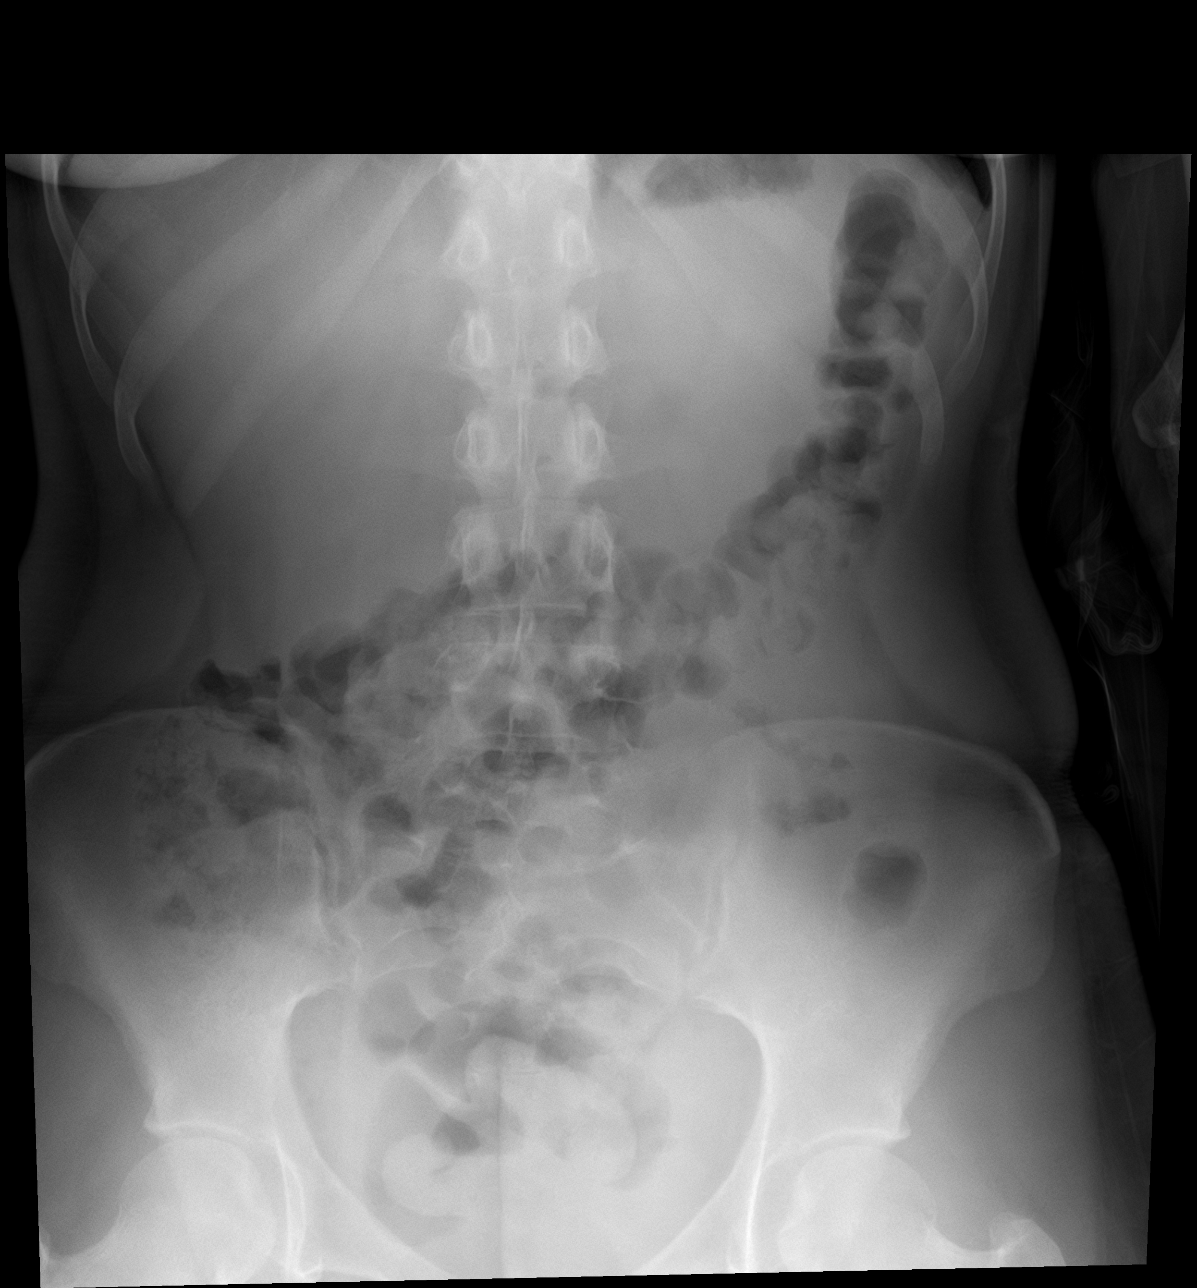

[3 of 3 positions shown; findings below may reference images not displayed]

FINDINGS: The upright chest x-ray demonstrates normal cardiomediastinal
contours. Minimal streaky right basilar atelectasis but no
infiltrates, edema or effusions.

Scattered air and stool in the colon and down into the rectum. No
distended small bowel loops to suggest obstruction. The soft tissue
shadows of the abdomen are maintained. No worrisome calcifications.
The bony structures are unremarkable.
IMPRESSION: 1. Minimal streaky right basilar atelectasis.
2. No plain film findings for an acute abdominal process.

## 2020-06-04 ENCOUNTER — Encounter: Payer: Self-pay | Admitting: Certified Nurse Midwife

## 2020-06-04 ENCOUNTER — Other Ambulatory Visit: Payer: Self-pay

## 2020-06-04 ENCOUNTER — Ambulatory Visit (INDEPENDENT_AMBULATORY_CARE_PROVIDER_SITE_OTHER): Payer: Medicaid Other | Admitting: Certified Nurse Midwife

## 2020-06-04 ENCOUNTER — Other Ambulatory Visit (HOSPITAL_COMMUNITY)
Admission: RE | Admit: 2020-06-04 | Discharge: 2020-06-04 | Disposition: A | Discharge status: Home / Self Care | Payer: Medicaid Other | Source: Ambulatory Visit | Attending: Certified Nurse Midwife | Admitting: Certified Nurse Midwife

## 2020-06-04 VITALS — BP 111/77 | HR 87 | Ht 63.0 in | Wt 104.2 lb

## 2020-06-04 DIAGNOSIS — Z01419 Encounter for gynecological examination (general) (routine) without abnormal findings: Secondary | ICD-10-CM | POA: Insufficient documentation

## 2020-06-04 DIAGNOSIS — Z23 Encounter for immunization: Secondary | ICD-10-CM

## 2020-06-04 DIAGNOSIS — Z124 Encounter for screening for malignant neoplasm of cervix: Secondary | ICD-10-CM | POA: Diagnosis present

## 2020-06-04 DIAGNOSIS — Z0000 Encounter for general adult medical examination without abnormal findings: Secondary | ICD-10-CM | POA: Diagnosis not present

## 2020-06-04 DIAGNOSIS — Z5320 Procedure and treatment not carried out because of patient's decision for unspecified reasons: Secondary | ICD-10-CM

## 2020-06-04 DIAGNOSIS — Z3042 Encounter for surveillance of injectable contraceptive: Secondary | ICD-10-CM

## 2020-06-04 DIAGNOSIS — Z13220 Encounter for screening for lipoid disorders: Secondary | ICD-10-CM

## 2020-06-04 MED ORDER — MEDROXYPROGESTERONE ACETATE 150 MG/ML IM SUSP
150.0000 mg | Freq: Once | INTRAMUSCULAR | Status: AC
  Administered 2020-06-04: 150 mg via INTRAMUSCULAR

## 2020-06-04 MED ORDER — MEDROXYPROGESTERONE ACETATE 150 MG/ML IM SUSP: 150.0000 mg | INTRAMUSCULAR | 3 refills | Status: AC

## 2020-06-04 NOTE — Patient Instructions (Signed)
Preventive Care 20-21 Years Old, Female Preventive care refers to visits with your health care provider and lifestyle choices that can promote health and wellness. This includes:  A yearly physical exam. This may also be called an annual well check.  Regular dental visits and eye exams.  Immunizations.  Screening for certain conditions.  Healthy lifestyle choices, such as eating a healthy diet, getting regular exercise, not using drugs or products that contain nicotine and tobacco, and limiting alcohol use. What can I expect for my preventive care visit? Physical exam Your health care provider will check your:  Height and weight. This may be used to calculate body mass index (BMI), which tells if you are at a healthy weight.  Heart rate and blood pressure.  Skin for abnormal spots. Counseling Your health care provider may ask you questions about your:  Alcohol, tobacco, and drug use.  Emotional well-being.  Home and relationship well-being.  Sexual activity.  Eating habits.  Work and work Statistician.  Method of birth control.  Menstrual cycle.  Pregnancy history. What immunizations do I need?  Influenza (flu) vaccine  This is recommended every year. Tetanus, diphtheria, and pertussis (Tdap) vaccine  You may need a Td booster every 10 years. Varicella (chickenpox) vaccine  You may need this if you have not been vaccinated. Human papillomavirus (HPV) vaccine  If recommended by your health care provider, you may need three doses over 6 months. Measles, mumps, and rubella (MMR) vaccine  You may need at least one dose of MMR. You may also need a second dose. Meningococcal conjugate (MenACWY) vaccine  One dose is recommended if you are age 75-21 years and a first-year college student living in a residence hall, or if you have one of several medical conditions. You may also need additional booster doses. Pneumococcal conjugate (PCV13) vaccine  You may need  this if you have certain conditions and were not previously vaccinated. Pneumococcal polysaccharide (PPSV23) vaccine  You may need one or two doses if you smoke cigarettes or if you have certain conditions. Hepatitis A vaccine  You may need this if you have certain conditions or if you travel or work in places where you may be exposed to hepatitis A. Hepatitis B vaccine  You may need this if you have certain conditions or if you travel or work in places where you may be exposed to hepatitis B. Haemophilus influenzae type b (Hib) vaccine  You may need this if you have certain conditions. You may receive vaccines as individual doses or as more than one vaccine together in one shot (combination vaccines). Talk with your health care provider about the risks and benefits of combination vaccines. What tests do I need?  Blood tests  Lipid and cholesterol levels. These may be checked every 5 years starting at age 33.  Hepatitis C test.  Hepatitis B test. Screening  Diabetes screening. This is done by checking your blood sugar (glucose) after you have not eaten for a while (fasting).  Sexually transmitted disease (STD) testing.  BRCA-related cancer screening. This may be done if you have a family history of breast, ovarian, tubal, or peritoneal cancers.  Pelvic exam and Pap test. This may be done every 3 years starting at age 76. Starting at age 102, this may be done every 5 years if you have a Pap test in combination with an HPV test. Talk with your health care provider about your test results, treatment options, and if necessary, the need for more tests.  Follow these instructions at home: Eating and drinking   Eat a diet that includes fresh fruits and vegetables, whole grains, lean protein, and low-fat dairy.  Take vitamin and mineral supplements as recommended by your health care provider.  Do not drink alcohol if: ? Your health care provider tells you not to drink. ? You are  pregnant, may be pregnant, or are planning to become pregnant.  If you drink alcohol: ? Limit how much you have to 0-1 drink a day. ? Be aware of how much alcohol is in your drink. In the [place] one drink equals one 12 oz bottle of beer (355 mL), one 5 oz glass of wine (148 mL), or one 1 oz glass of hard liquor (44 mL). Lifestyle  Take daily care of your teeth and gums.  Stay active. Exercise for at least 30 minutes on 5 or more days each week.  Do not use any products that contain nicotine or tobacco, such as cigarettes, e-cigarettes, and chewing tobacco. If you need help quitting, ask your health care provider.  If you are sexually active, practice safe sex. Use a condom or other form of birth control (contraception) in order to prevent pregnancy and STIs (sexually transmitted infections). If you plan to become pregnant, see your health care provider for a preconception visit. What's next?  Visit your health care provider once a year for a well check visit.  Ask your health care provider how often you should have your eyes and teeth checked.  Stay up to date on all vaccines. This information is not intended to replace advice given to you by your health care provider. Make sure you discuss any questions you have with your health care provider. Document Revised: 04/01/2018 Document Reviewed: 04/01/2018 Elsevier Patient Education  2020 [employer].

## 2020-06-04 NOTE — Progress Notes (Signed)
GYNECOLOGY ANNUAL PREVENTATIVE CARE ENCOUNTER NOTE  History:     Holly Montgomery is a 19 y.o. G31P1011 female here for a routine annual gynecologic exam.  Current complaints: none.   Denies abnormal vaginal bleeding, discharge, pelvic pain, problems with intercourse or other gynecologic concerns.     Social Relationship: fiance Living:with Devon and son. Moving to [place] Work:none Exercise:chasing after baby Smoke/Alcohol/drug [facility]:ERDE alcohol. Denies smoking and drugs  Gynecologic History No LMP recorded. Patient has had an injection. Contraception: Depo-Provera injections Last Pap: has not had Last mammogram: n/a   Obstetric History OB History  Gravida Para Term Preterm AB Living  2 1 1   1 1   SAB TAB Ectopic Multiple Live Births  1     0 1    # Outcome Date GA Lbr Len/2nd Weight Sex Delivery Anes PTL Lv  2 Term 10/19/19 [redacted]w[redacted]d 06:20 / 00:32 5 lb 13.5 oz (2.65 kg) M Vag-Spont EPI  LIV  1 SAB 2019            Past Medical History:  Diagnosis Date  . Anemia   . Depression     Past Surgical History:  Procedure Laterality Date  . NO PAST SURGERIES      Current Outpatient Medications on File Prior to Visit  Medication Sig Dispense Refill  . acetaminophen (TYLENOL) 325 MG tablet Take 2 tablets (650 mg total) by mouth every 4 (four) hours as needed (for pain scale < 4). 60 tablet 0  . cyclobenzaprine (FLEXERIL) 10 MG tablet Take 10 mg by mouth 3 (three) times daily as needed for muscle spasms.    . ferrous sulfate 325 (65 FE) MG tablet Take 1 tablet (325 mg total) by mouth daily with breakfast. 30 tablet 3  . medroxyPROGESTERone (DEPO-PROVERA) 150 MG/ML injection Inject 1 mL (150 mg total) into the muscle every 3 (three) months. 1 mL 3  . Prenatal Vit-Fe Fumarate-FA (PRENATAL MULTIVITAMIN) TABS tablet Take 1 tablet by mouth daily at 12 noon.     No current facility-administered medications on file prior to visit.    Allergies  Allergen Reactions  . Motrin  [Ibuprofen] Hives  . Latex Rash    Social History:  reports that she has never smoked. She has never used smokeless tobacco. She reports previous alcohol use. She reports that she does not use drugs.  No family history on file.  The following portions of the patient's history were reviewed and updated as appropriate: allergies, current medications, past family history, past medical history, past social history, past surgical history and problem list.  Review of Systems Pertinent items noted in HPI and remainder of comprehensive ROS otherwise negative.  Physical Exam:  There were no vitals taken for this visit. CONSTITUTIONAL: Well-developed, well-nourished female in no acute distress.  HENT:  Normocephalic, atraumatic, External right and left ear normal. Oropharynx is clear and moist EYES: Conjunctivae and EOM are normal. Pupils are equal, round, and reactive to light. No scleral icterus.  NECK: Normal range of motion, supple, no masses.  Normal thyroid.  SKIN: Skin is warm and dry. No rash noted. Not diaphoretic. No erythema. No pallor. MUSCULOSKELETAL: Normal range of motion. No tenderness.  No cyanosis, clubbing, or edema.  2+ distal pulses. NEUROLOGIC: Alert and oriented to person, place, and time. Normal reflexes, muscle tone coordination.  PSYCHIATRIC: Normal mood and affect. Normal behavior. Normal judgment and thought content. CARDIOVASCULAR: Normal heart rate noted, regular rhythm RESPIRATORY: Clear to auscultation bilaterally. Effort and breath sounds  normal, no problems with respiration noted. BREASTS: Symmetric in size. No masses, tenderness, skin changes, nipple drainage, or lymphadenopathy bilaterally.  ABDOMEN: Soft, no distention noted.  No tenderness, rebound or guarding.  PELVIC: Normal appearing external genitalia and urethral meatus; normal appearing vaginal mucosa and cervix.  No abnormal discharge noted.  Pap smear obtained.  Normal uterine size, no other palpable  masses, no uterine or adnexal tenderness.  .   Assessment and Plan:    1. Women's annual routine gynecological examination   Pap:Will follow up results of pap smear and manage accordingly. Mammogram :n/a  Labs: Hep c , lipid profile  Refills: depo provera Referral:none Routine preventative health maintenance measures emphasized. Please refer to After Visit Summary for other counseling recommendations.      Doreene Burke, CNM [facility],  [facility]

## 2020-06-05 LAB — LIPID PANEL
Chol/HDL Ratio: 2 ratio (ref 0.0–4.4)
Cholesterol, Total: 141 mg/dL (ref 100–199)
HDL: 71 mg/dL (ref >39)
LDL Chol Calc (NIH): 59 mg/dL (ref 0–99)
Triglycerides: 52 mg/dL (ref 0–149)
VLDL Cholesterol Cal: 11 mg/dL (ref 5–40)

## 2020-06-05 LAB — HEPATITIS C ANTIBODY: Hep C Virus Ab: 0.1 s/co ratio (ref 0.0–0.9)

## 2020-06-12 LAB — CYTOLOGY - PAP
Chlamydia: NEGATIVE
Comment: NEGATIVE
Comment: NEGATIVE
Comment: NEGATIVE
Comment: NEGATIVE
Comment: NORMAL
Diagnosis: UNDETERMINED — AB
HPV 16: NEGATIVE
HPV 18 / 45: NEGATIVE
High risk HPV: POSITIVE — AB
Neisseria Gonorrhea: NEGATIVE
Trichomonas: NEGATIVE
